# Patient Record
Sex: Female | Born: 1935 | Race: White | Hispanic: No | Smoking: Former smoker
Health system: Southern US, Community
[De-identification: ages and names within clinical notes are randomized; demographics above are authoritative.]

## PROBLEM LIST (undated history)

## (undated) DIAGNOSIS — J449 Chronic obstructive pulmonary disease, unspecified: Secondary | ICD-10-CM

## (undated) DIAGNOSIS — T7840XA Allergy, unspecified, initial encounter: Secondary | ICD-10-CM

## (undated) DIAGNOSIS — M353 Polymyalgia rheumatica: Secondary | ICD-10-CM

## (undated) DIAGNOSIS — I10 Essential (primary) hypertension: Secondary | ICD-10-CM

## (undated) DIAGNOSIS — E785 Hyperlipidemia, unspecified: Secondary | ICD-10-CM

## (undated) HISTORY — DX: Hyperlipidemia, unspecified: E78.5

## (undated) HISTORY — DX: Chronic obstructive pulmonary disease, unspecified: J44.9

## (undated) HISTORY — DX: Polymyalgia rheumatica: M35.3

## (undated) HISTORY — DX: Allergy, unspecified, initial encounter: T78.40XA

## (undated) HISTORY — PX: THYROGLOSSAL DUCT CYST: SHX297

## (undated) HISTORY — PX: DILATION AND CURETTAGE OF UTERUS: SHX78

## (undated) HISTORY — DX: Essential (primary) hypertension: I10

## (undated) HISTORY — PX: OTHER SURGICAL HISTORY: SHX169

---

## 2004-04-08 ENCOUNTER — Encounter: Admission: RE | Admit: 2004-04-08 | Discharge: 2004-05-15 | Payer: Self-pay | Admitting: Internal Medicine

## 2004-06-04 ENCOUNTER — Ambulatory Visit: Payer: Self-pay | Admitting: Critical Care Medicine

## 2004-06-30 ENCOUNTER — Ambulatory Visit: Payer: Self-pay | Admitting: Internal Medicine

## 2004-07-14 ENCOUNTER — Ambulatory Visit: Payer: Self-pay | Admitting: Critical Care Medicine

## 2004-07-21 ENCOUNTER — Ambulatory Visit: Payer: Self-pay | Admitting: Critical Care Medicine

## 2004-08-26 ENCOUNTER — Ambulatory Visit: Payer: Self-pay | Admitting: Internal Medicine

## 2004-09-22 ENCOUNTER — Ambulatory Visit: Payer: Self-pay | Admitting: Critical Care Medicine

## 2004-10-06 ENCOUNTER — Ambulatory Visit: Payer: Self-pay | Admitting: Internal Medicine

## 2004-11-17 ENCOUNTER — Ambulatory Visit: Payer: Self-pay | Admitting: Internal Medicine

## 2004-11-18 ENCOUNTER — Ambulatory Visit: Payer: Self-pay | Admitting: Critical Care Medicine

## 2004-11-19 ENCOUNTER — Ambulatory Visit: Payer: Self-pay | Admitting: Internal Medicine

## 2004-11-24 ENCOUNTER — Ambulatory Visit: Payer: Self-pay | Admitting: Internal Medicine

## 2004-11-28 ENCOUNTER — Ambulatory Visit: Payer: Self-pay | Admitting: Internal Medicine

## 2005-02-17 ENCOUNTER — Ambulatory Visit: Payer: Self-pay | Admitting: Critical Care Medicine

## 2005-02-27 ENCOUNTER — Ambulatory Visit: Payer: Self-pay | Admitting: Internal Medicine

## 2005-03-12 ENCOUNTER — Ambulatory Visit: Payer: Self-pay | Admitting: Critical Care Medicine

## 2005-04-08 ENCOUNTER — Ambulatory Visit: Payer: Self-pay | Admitting: Internal Medicine

## 2005-05-20 ENCOUNTER — Ambulatory Visit: Payer: Self-pay | Admitting: Internal Medicine

## 2005-06-17 ENCOUNTER — Ambulatory Visit: Payer: Self-pay | Admitting: Critical Care Medicine

## 2005-07-01 ENCOUNTER — Ambulatory Visit: Payer: Self-pay | Admitting: Internal Medicine

## 2005-07-28 ENCOUNTER — Ambulatory Visit: Payer: Self-pay | Admitting: Internal Medicine

## 2005-08-24 ENCOUNTER — Ambulatory Visit: Payer: Self-pay | Admitting: Internal Medicine

## 2005-09-15 ENCOUNTER — Ambulatory Visit: Payer: Self-pay | Admitting: Critical Care Medicine

## 2005-09-28 ENCOUNTER — Ambulatory Visit: Payer: Self-pay | Admitting: Internal Medicine

## 2005-10-16 ENCOUNTER — Ambulatory Visit: Payer: Self-pay | Admitting: Internal Medicine

## 2005-11-12 ENCOUNTER — Ambulatory Visit: Payer: Self-pay | Admitting: Pulmonary Disease

## 2005-11-19 ENCOUNTER — Ambulatory Visit: Payer: Self-pay | Admitting: Critical Care Medicine

## 2005-11-24 ENCOUNTER — Ambulatory Visit: Payer: Self-pay | Admitting: Internal Medicine

## 2006-01-05 ENCOUNTER — Ambulatory Visit: Payer: Self-pay | Admitting: Critical Care Medicine

## 2006-01-19 ENCOUNTER — Ambulatory Visit: Payer: Self-pay | Admitting: Internal Medicine

## 2006-02-22 ENCOUNTER — Ambulatory Visit: Payer: Self-pay | Admitting: Family Medicine

## 2006-03-30 ENCOUNTER — Ambulatory Visit: Payer: Self-pay | Admitting: Critical Care Medicine

## 2006-04-06 ENCOUNTER — Ambulatory Visit: Payer: Self-pay | Admitting: Internal Medicine

## 2006-04-16 ENCOUNTER — Ambulatory Visit: Payer: Self-pay | Admitting: Internal Medicine

## 2006-05-10 ENCOUNTER — Ambulatory Visit: Payer: Self-pay | Admitting: Internal Medicine

## 2006-05-12 ENCOUNTER — Ambulatory Visit: Payer: Self-pay | Admitting: Critical Care Medicine

## 2006-05-12 ENCOUNTER — Encounter: Admission: RE | Admit: 2006-05-12 | Discharge: 2006-05-12 | Payer: Self-pay | Admitting: Internal Medicine

## 2006-05-20 ENCOUNTER — Ambulatory Visit: Payer: Self-pay | Admitting: Internal Medicine

## 2006-05-20 LAB — CONVERTED CEMR LAB
ALT: 27 units/L (ref 0–40)
AST: 23 units/L (ref 0–37)
Albumin: 4 g/dL (ref 3.5–5.2)
Alkaline Phosphatase: 89 units/L (ref 39–117)
BUN: 38 mg/dL — ABNORMAL HIGH (ref 6–23)
Basophils Absolute: 0 10*3/uL (ref 0.0–0.1)
Basophils Relative: 0 % (ref 0.0–1.0)
CO2: 27 meq/L (ref 19–32)
Calcium: 9.8 mg/dL (ref 8.4–10.5)
Chloride: 108 meq/L (ref 96–112)
Chol/HDL Ratio, serum: 5.3
Cholesterol: 240 mg/dL (ref 0–200)
Creatinine, Ser: 1.1 mg/dL (ref 0.4–1.2)
Eosinophil percent: 3.4 % (ref 0.0–5.0)
GFR calc non Af Amer: 50 mL/min
Glomerular Filtration Rate, Af Am: 61 mL/min/{1.73_m2}
Glucose, Bld: 101 mg/dL — ABNORMAL HIGH (ref 70–99)
HCT: 40.5 % (ref 36.0–46.0)
HDL: 45.4 mg/dL (ref >39.0)
Hemoglobin: 13.6 g/dL (ref 12.0–15.0)
LDL DIRECT: 170.9 mg/dL
Lymphocytes Relative: 30.5 % (ref 12.0–46.0)
MCHC: 33.6 g/dL (ref 30.0–36.0)
MCV: 90.9 fL (ref 78.0–100.0)
Monocytes Absolute: 0.5 10*3/uL (ref 0.2–0.7)
Monocytes Relative: 10 % (ref 3.0–11.0)
Neutro Abs: 2.8 10*3/uL (ref 1.4–7.7)
Neutrophils Relative %: 56.1 % (ref 43.0–77.0)
Platelets: 233 10*3/uL (ref 150–400)
Potassium: 4.8 meq/L (ref 3.5–5.1)
RBC: 4.45 M/uL (ref 3.87–5.11)
RDW: 12.6 % (ref 11.5–14.6)
Sodium: 144 meq/L (ref 135–145)
TSH: 1.54 microintl units/mL (ref 0.35–5.50)
Total Bilirubin: 0.7 mg/dL (ref 0.3–1.2)
Total Protein: 7 g/dL (ref 6.0–8.3)
Triglyceride fasting, serum: 103 mg/dL (ref 0–149)
VLDL: 21 mg/dL (ref 0–40)
WBC: 5.1 10*3/uL (ref 4.5–10.5)

## 2006-05-27 ENCOUNTER — Ambulatory Visit: Payer: Self-pay | Admitting: Internal Medicine

## 2006-08-23 ENCOUNTER — Ambulatory Visit: Payer: Self-pay | Admitting: Critical Care Medicine

## 2006-08-27 ENCOUNTER — Ambulatory Visit: Payer: Self-pay | Admitting: Internal Medicine

## 2006-09-10 ENCOUNTER — Ambulatory Visit: Payer: Self-pay | Admitting: Internal Medicine

## 2006-10-04 ENCOUNTER — Ambulatory Visit: Payer: Self-pay | Admitting: Internal Medicine

## 2006-10-18 ENCOUNTER — Ambulatory Visit: Payer: Self-pay | Admitting: Internal Medicine

## 2006-10-19 ENCOUNTER — Encounter: Admission: RE | Admit: 2006-10-19 | Discharge: 2006-10-19 | Payer: Self-pay | Admitting: Internal Medicine

## 2006-11-03 ENCOUNTER — Ambulatory Visit: Payer: Self-pay | Admitting: Critical Care Medicine

## 2006-11-12 ENCOUNTER — Ambulatory Visit: Payer: Self-pay | Admitting: Internal Medicine

## 2006-12-06 ENCOUNTER — Ambulatory Visit: Payer: Self-pay | Admitting: Critical Care Medicine

## 2006-12-20 ENCOUNTER — Ambulatory Visit: Payer: Self-pay | Admitting: Internal Medicine

## 2006-12-20 DIAGNOSIS — E785 Hyperlipidemia, unspecified: Secondary | ICD-10-CM | POA: Insufficient documentation

## 2006-12-20 DIAGNOSIS — J449 Chronic obstructive pulmonary disease, unspecified: Secondary | ICD-10-CM

## 2006-12-20 DIAGNOSIS — I10 Essential (primary) hypertension: Secondary | ICD-10-CM | POA: Insufficient documentation

## 2006-12-20 DIAGNOSIS — M810 Age-related osteoporosis without current pathological fracture: Secondary | ICD-10-CM | POA: Insufficient documentation

## 2006-12-21 ENCOUNTER — Ambulatory Visit: Payer: Self-pay | Admitting: Critical Care Medicine

## 2006-12-21 LAB — CONVERTED CEMR LAB
ALT: 33 units/L (ref 0–40)
AST: 29 units/L (ref 0–37)
Albumin: 4.1 g/dL (ref 3.5–5.2)
Alkaline Phosphatase: 78 units/L (ref 39–117)
BUN: 34 mg/dL — ABNORMAL HIGH (ref 6–23)
Bilirubin, Direct: 0.1 mg/dL (ref 0.0–0.3)
CO2: 26 meq/L (ref 19–32)
Calcium: 9.4 mg/dL (ref 8.4–10.5)
Chloride: 110 meq/L (ref 96–112)
Cholesterol: 231 mg/dL (ref 0–200)
Creatinine, Ser: 1.1 mg/dL (ref 0.4–1.2)
Direct LDL: 143.6 mg/dL
GFR calc Af Amer: 61 mL/min
GFR calc non Af Amer: 50 mL/min
Glucose, Bld: 87 mg/dL (ref 70–99)
HDL: 50.2 mg/dL (ref >39.0)
Potassium: 5.4 meq/L — ABNORMAL HIGH (ref 3.5–5.1)
Sodium: 143 meq/L (ref 135–145)
Total Bilirubin: 0.7 mg/dL (ref 0.3–1.2)
Total CHOL/HDL Ratio: 4.6
Total Protein: 6.7 g/dL (ref 6.0–8.3)
Triglycerides: 184 mg/dL — ABNORMAL HIGH (ref 0–149)
VLDL: 37 mg/dL (ref 0–40)

## 2007-02-23 ENCOUNTER — Ambulatory Visit: Payer: Self-pay | Admitting: Internal Medicine

## 2007-03-01 ENCOUNTER — Ambulatory Visit: Payer: Self-pay | Admitting: Pulmonary Disease

## 2007-03-16 ENCOUNTER — Encounter: Payer: Self-pay | Admitting: Internal Medicine

## 2007-05-11 ENCOUNTER — Ambulatory Visit: Payer: Self-pay | Admitting: Internal Medicine

## 2007-06-09 ENCOUNTER — Ambulatory Visit: Payer: Self-pay | Admitting: Critical Care Medicine

## 2007-08-09 ENCOUNTER — Ambulatory Visit: Payer: Self-pay | Admitting: Internal Medicine

## 2007-08-10 LAB — CONVERTED CEMR LAB
Basophils Relative: 0.4 % (ref 0.0–1.0)
Eosinophils Relative: 1.7 % (ref 0.0–5.0)
HCT: 40.6 % (ref 36.0–46.0)
Neutrophils Relative %: 59.2 % (ref 43.0–77.0)
RBC: 4.47 M/uL (ref 3.87–5.11)
RDW: 12.2 % (ref 11.5–14.6)
WBC: 7 10*3/uL (ref 4.5–10.5)

## 2007-09-14 ENCOUNTER — Ambulatory Visit: Payer: Self-pay | Admitting: Internal Medicine

## 2007-09-15 LAB — CONVERTED CEMR LAB
Calcium: 9.6 mg/dL (ref 8.4–10.5)
Chloride: 103 meq/L (ref 96–112)
Direct LDL: 138 mg/dL
GFR calc non Af Amer: 56 mL/min
Sodium: 139 meq/L (ref 135–145)
Total CHOL/HDL Ratio: 3.8
VLDL: 22 mg/dL (ref 0–40)

## 2007-09-29 ENCOUNTER — Telehealth: Payer: Self-pay | Admitting: Internal Medicine

## 2007-09-29 ENCOUNTER — Ambulatory Visit: Payer: Self-pay | Admitting: Internal Medicine

## 2007-12-16 ENCOUNTER — Ambulatory Visit: Payer: Self-pay | Admitting: Internal Medicine

## 2007-12-20 ENCOUNTER — Telehealth: Payer: Self-pay | Admitting: Internal Medicine

## 2008-01-12 ENCOUNTER — Ambulatory Visit: Payer: Self-pay | Admitting: Internal Medicine

## 2008-01-26 ENCOUNTER — Ambulatory Visit: Payer: Self-pay | Admitting: Internal Medicine

## 2008-02-10 ENCOUNTER — Telehealth: Payer: Self-pay | Admitting: Internal Medicine

## 2008-03-26 ENCOUNTER — Telehealth: Payer: Self-pay | Admitting: *Deleted

## 2008-04-12 IMAGING — CR DG LUMBAR SPINE COMPLETE 4+V
5 series · 5 of 5 positions shown · non-contrast
Comparison: none

CLINICAL DATA: Mid to low back pain.  Question compression deformity. 
THORACIC SPINE THREE VIEWS:
No comparison.

[t l-spine a.p.]
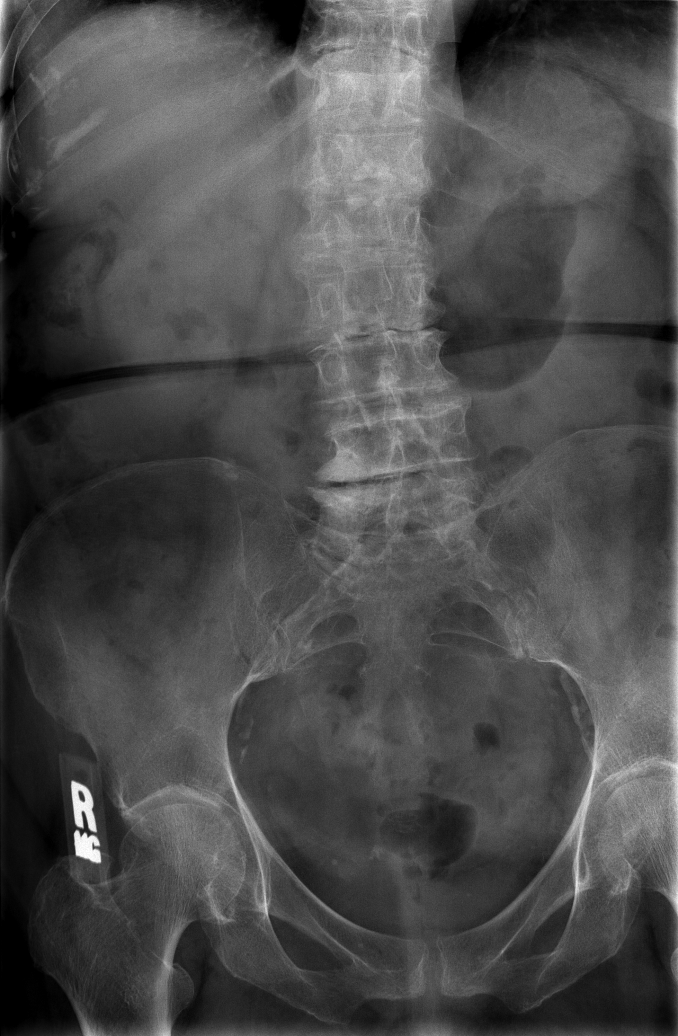

[t l-spine oblique exposure (1 of 2)]
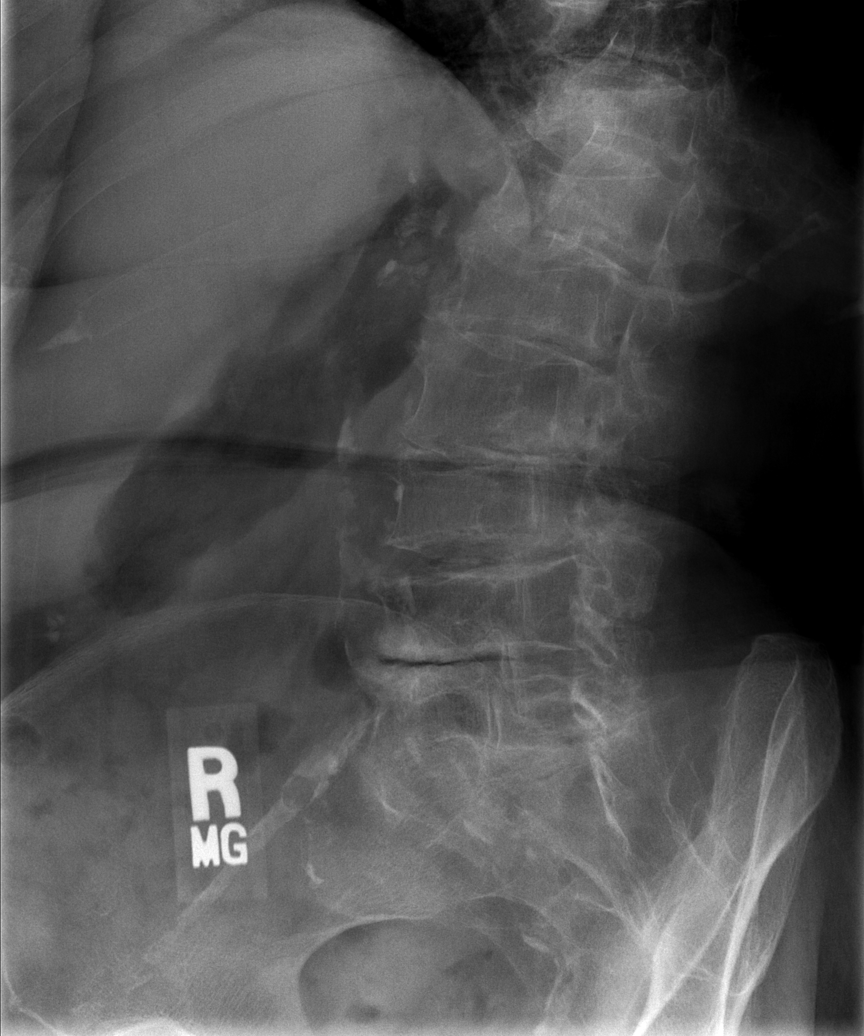

[t l-spine oblique exposure (2 of 2)]
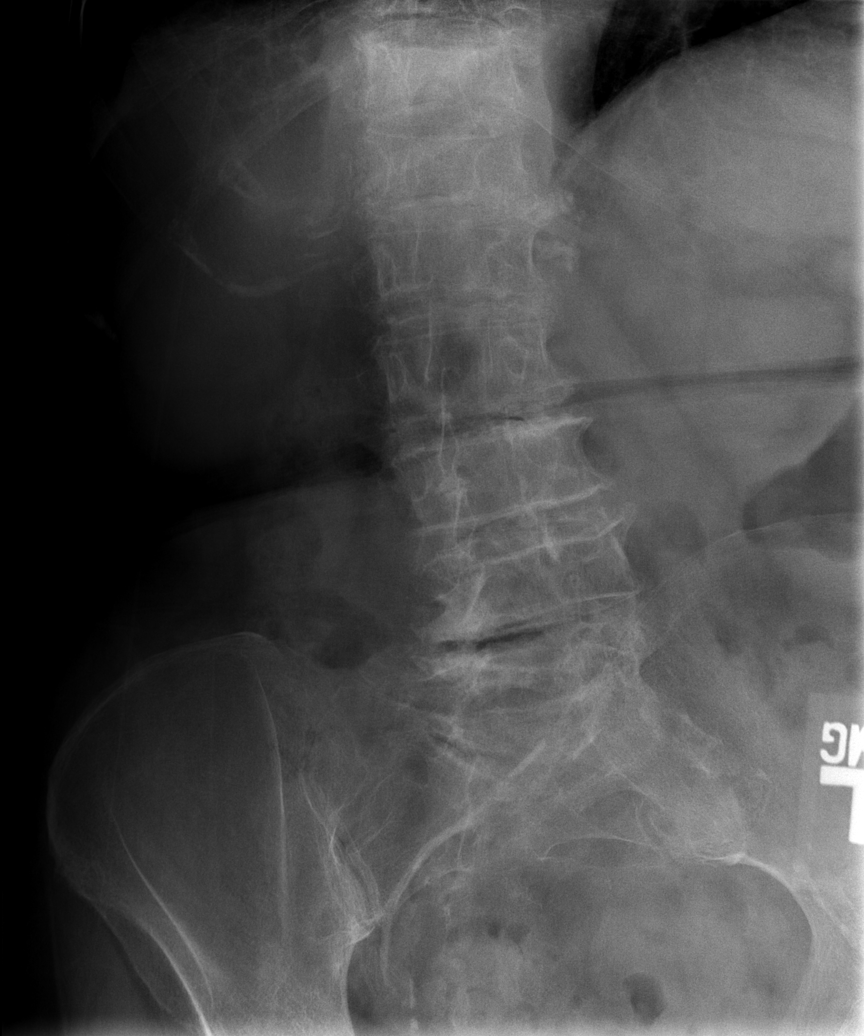

[t l-spine lat]
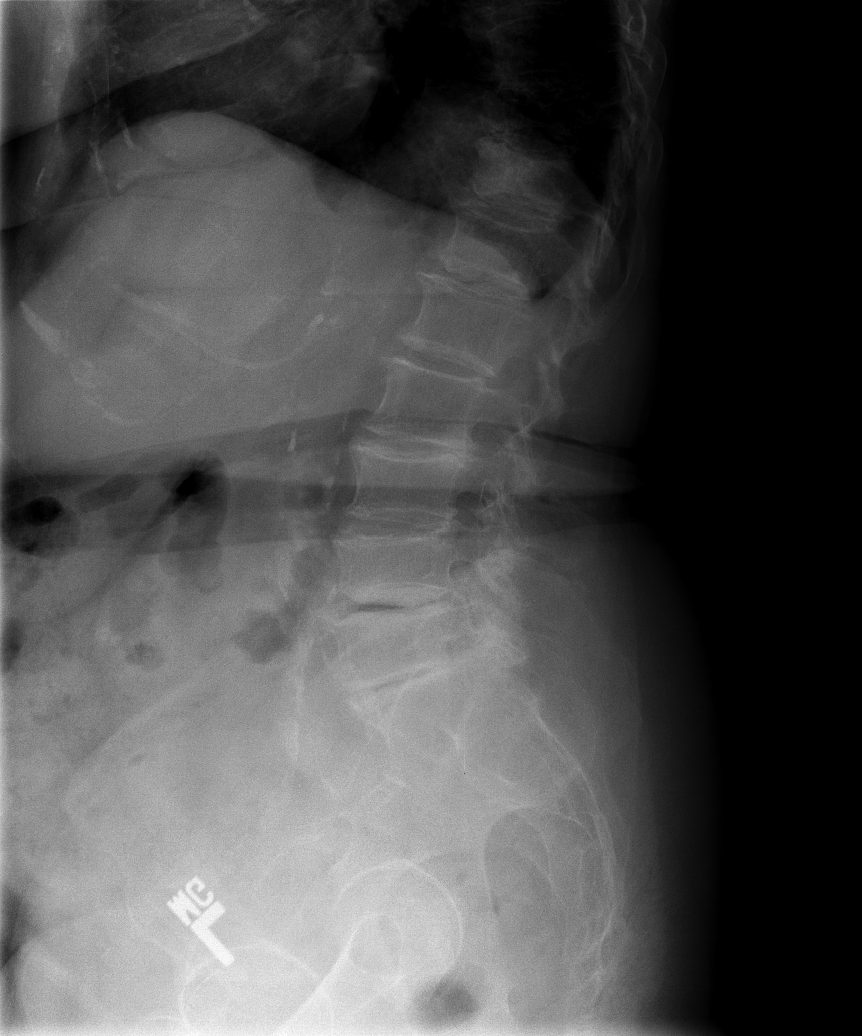

[t l-spine l5-s1 spot]
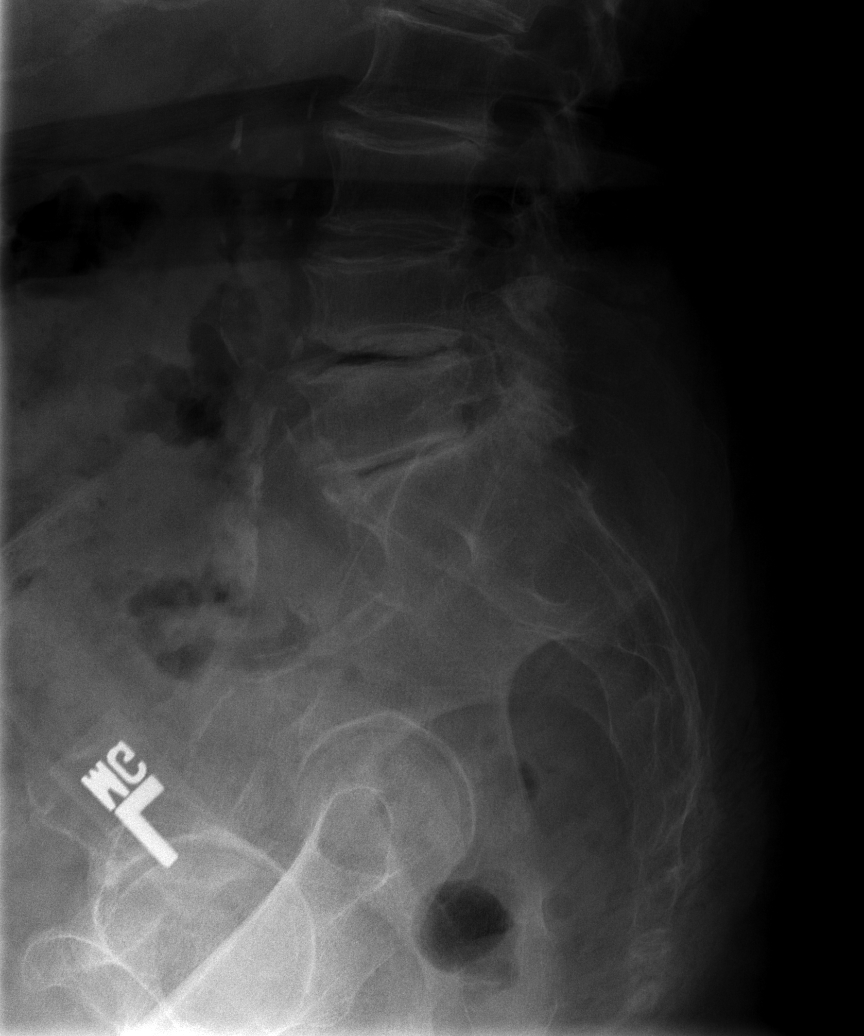

[5 of 5 positions shown; findings below may reference images not displayed]

FINDINGS: There is osteopenia.  An approximately 40% anterior compression deformity is noted at T10.  There is also mild anterior wedging of T11.  These fracture are radiographically indeterminant in age, but no definite acute components are seen.  There is no retropulsion.  The alignment is anatomic.  There is no widening of the interpedicular distance or evidence of paraspinal hematoma.
IMPRESSION: Mild compression deformities at T10 and T11, radiographically indeterminant, but not likely acute; correlate with area of pain.  Moderate osteopenia.  
LUMBAR SPINE FOUR VIEWS:
There are five lumbar type vertebral bodies demonstrating a mild convex right scoliosis and anatomic lateral alignment.  There is multilevel disc space loss, most advanced at L4-5 and L5-S1.  Facet degenerative changes are present bilaterally.  There is no evidence of acute fracture or pars defect.  There is atherosclerosis of the iliac arteries.
IMPRESSION: No evidence of acute lumbar spine compression deformity.  Moderate spondylosis and osteopenia are noted.

## 2008-04-23 ENCOUNTER — Ambulatory Visit: Payer: Self-pay | Admitting: Critical Care Medicine

## 2008-05-30 ENCOUNTER — Ambulatory Visit: Payer: Self-pay | Admitting: Critical Care Medicine

## 2008-07-25 ENCOUNTER — Ambulatory Visit: Payer: Self-pay | Admitting: Internal Medicine

## 2008-07-26 LAB — CONVERTED CEMR LAB
ALT: 24 units/L (ref 0–35)
AST: 22 units/L (ref 0–37)
Alkaline Phosphatase: 75 units/L (ref 39–117)
Bilirubin, Direct: 0.1 mg/dL (ref 0.0–0.3)
CO2: 25 meq/L (ref 19–32)
Calcium: 9.4 mg/dL (ref 8.4–10.5)
Chloride: 109 meq/L (ref 96–112)
Glucose, Bld: 109 mg/dL — ABNORMAL HIGH (ref 70–99)
Hemoglobin: 13 g/dL (ref 12.0–15.0)
Lymphocytes Relative: 31.2 % (ref 12.0–46.0)
Monocytes Relative: 10.8 % (ref 3.0–12.0)
Neutro Abs: 2.6 10*3/uL (ref 1.4–7.7)
Potassium: 5 meq/L (ref 3.5–5.1)
RBC: 4.08 M/uL (ref 3.87–5.11)
RDW: 12.2 % (ref 11.5–14.6)
Sodium: 140 meq/L (ref 135–145)
Total Protein: 6.7 g/dL (ref 6.0–8.3)

## 2008-08-27 ENCOUNTER — Ambulatory Visit: Payer: Self-pay | Admitting: Critical Care Medicine

## 2008-11-14 ENCOUNTER — Ambulatory Visit: Payer: Self-pay | Admitting: Family Medicine

## 2008-11-22 ENCOUNTER — Telehealth: Payer: Self-pay | Admitting: Internal Medicine

## 2008-12-13 ENCOUNTER — Ambulatory Visit: Payer: Self-pay | Admitting: Critical Care Medicine

## 2008-12-21 ENCOUNTER — Encounter: Payer: Self-pay | Admitting: Critical Care Medicine

## 2009-02-08 ENCOUNTER — Ambulatory Visit: Payer: Self-pay | Admitting: Critical Care Medicine

## 2009-03-22 ENCOUNTER — Ambulatory Visit: Payer: Self-pay | Admitting: Internal Medicine

## 2009-03-22 DIAGNOSIS — M353 Polymyalgia rheumatica: Secondary | ICD-10-CM

## 2009-04-29 ENCOUNTER — Ambulatory Visit: Payer: Self-pay | Admitting: Critical Care Medicine

## 2009-05-16 ENCOUNTER — Telehealth (INDEPENDENT_AMBULATORY_CARE_PROVIDER_SITE_OTHER): Payer: Self-pay | Admitting: *Deleted

## 2009-05-17 ENCOUNTER — Ambulatory Visit: Payer: Self-pay | Admitting: Internal Medicine

## 2009-05-17 DIAGNOSIS — M1990 Unspecified osteoarthritis, unspecified site: Secondary | ICD-10-CM

## 2009-06-13 ENCOUNTER — Telehealth (INDEPENDENT_AMBULATORY_CARE_PROVIDER_SITE_OTHER): Payer: Self-pay | Admitting: *Deleted

## 2009-06-13 ENCOUNTER — Telehealth: Payer: Self-pay | Admitting: Critical Care Medicine

## 2009-06-13 ENCOUNTER — Ambulatory Visit: Payer: Self-pay | Admitting: Family Medicine

## 2009-07-02 ENCOUNTER — Ambulatory Visit (HOSPITAL_COMMUNITY): Admission: RE | Admit: 2009-07-02 | Discharge: 2009-07-02 | Payer: Self-pay | Admitting: Family Medicine

## 2009-07-02 ENCOUNTER — Encounter: Payer: Self-pay | Admitting: Family Medicine

## 2009-07-02 ENCOUNTER — Ambulatory Visit: Payer: Self-pay

## 2009-07-02 ENCOUNTER — Ambulatory Visit: Payer: Self-pay | Admitting: Cardiology

## 2009-07-02 ENCOUNTER — Encounter: Payer: Self-pay | Admitting: Internal Medicine

## 2009-07-08 ENCOUNTER — Telehealth: Payer: Self-pay | Admitting: Internal Medicine

## 2009-07-10 ENCOUNTER — Telehealth: Payer: Self-pay | Admitting: Internal Medicine

## 2009-07-11 ENCOUNTER — Telehealth (INDEPENDENT_AMBULATORY_CARE_PROVIDER_SITE_OTHER): Payer: Self-pay | Admitting: *Deleted

## 2009-08-01 ENCOUNTER — Ambulatory Visit: Payer: Self-pay | Admitting: Family Medicine

## 2009-08-02 ENCOUNTER — Encounter: Payer: Self-pay | Admitting: Internal Medicine

## 2009-08-05 ENCOUNTER — Ambulatory Visit: Payer: Self-pay | Admitting: Internal Medicine

## 2009-08-09 ENCOUNTER — Ambulatory Visit: Payer: Self-pay | Admitting: Internal Medicine

## 2009-08-15 ENCOUNTER — Telehealth: Payer: Self-pay | Admitting: Internal Medicine

## 2009-08-19 ENCOUNTER — Ambulatory Visit: Payer: Self-pay | Admitting: Internal Medicine

## 2009-08-23 ENCOUNTER — Telehealth: Payer: Self-pay | Admitting: Internal Medicine

## 2009-08-30 ENCOUNTER — Ambulatory Visit: Payer: Self-pay | Admitting: Critical Care Medicine

## 2009-09-04 ENCOUNTER — Ambulatory Visit: Payer: Self-pay | Admitting: Internal Medicine

## 2009-09-04 DIAGNOSIS — M109 Gout, unspecified: Secondary | ICD-10-CM | POA: Insufficient documentation

## 2009-09-09 ENCOUNTER — Telehealth: Payer: Self-pay | Admitting: Internal Medicine

## 2009-09-12 ENCOUNTER — Telehealth: Payer: Self-pay | Admitting: Internal Medicine

## 2009-09-20 ENCOUNTER — Ambulatory Visit: Payer: Self-pay | Admitting: Internal Medicine

## 2009-09-20 DIAGNOSIS — R609 Edema, unspecified: Secondary | ICD-10-CM | POA: Insufficient documentation

## 2009-10-25 ENCOUNTER — Ambulatory Visit: Payer: Self-pay | Admitting: Internal Medicine

## 2009-10-29 ENCOUNTER — Telehealth: Payer: Self-pay | Admitting: Internal Medicine

## 2009-11-05 ENCOUNTER — Telehealth: Payer: Self-pay | Admitting: Internal Medicine

## 2009-11-11 ENCOUNTER — Ambulatory Visit: Payer: Self-pay | Admitting: Internal Medicine

## 2009-11-12 ENCOUNTER — Encounter: Payer: Self-pay | Admitting: Internal Medicine

## 2009-11-20 ENCOUNTER — Ambulatory Visit: Payer: Self-pay | Admitting: Internal Medicine

## 2009-12-06 ENCOUNTER — Ambulatory Visit: Payer: Self-pay | Admitting: Internal Medicine

## 2009-12-06 LAB — CONVERTED CEMR LAB
Calcium: 9.4 mg/dL (ref 8.4–10.5)
Chloride: 110 meq/L (ref 96–112)
Creatinine, Ser: 0.9 mg/dL (ref 0.4–1.2)
Glucose, Bld: 94 mg/dL (ref 70–99)
Potassium: 4.3 meq/L (ref 3.5–5.1)
Sodium: 142 meq/L (ref 135–145)
Uric Acid, Serum: 8.8 mg/dL — ABNORMAL HIGH (ref 2.4–7.0)

## 2009-12-20 ENCOUNTER — Ambulatory Visit: Payer: Self-pay | Admitting: Critical Care Medicine

## 2009-12-20 ENCOUNTER — Encounter: Payer: Self-pay | Admitting: Internal Medicine

## 2009-12-24 ENCOUNTER — Encounter: Payer: Self-pay | Admitting: Internal Medicine

## 2010-01-22 ENCOUNTER — Ambulatory Visit: Payer: Self-pay | Admitting: Internal Medicine

## 2010-01-24 ENCOUNTER — Telehealth: Payer: Self-pay | Admitting: Internal Medicine

## 2010-01-28 ENCOUNTER — Telehealth: Payer: Self-pay | Admitting: Internal Medicine

## 2010-02-07 ENCOUNTER — Ambulatory Visit: Payer: Self-pay | Admitting: Internal Medicine

## 2010-02-21 ENCOUNTER — Ambulatory Visit: Payer: Self-pay | Admitting: Internal Medicine

## 2010-03-05 ENCOUNTER — Telehealth: Payer: Self-pay | Admitting: Internal Medicine

## 2010-03-05 ENCOUNTER — Ambulatory Visit: Payer: Self-pay | Admitting: Internal Medicine

## 2010-03-28 ENCOUNTER — Ambulatory Visit: Payer: Self-pay | Admitting: Internal Medicine

## 2010-04-25 ENCOUNTER — Ambulatory Visit: Payer: Self-pay | Admitting: Internal Medicine

## 2010-04-28 ENCOUNTER — Telehealth (INDEPENDENT_AMBULATORY_CARE_PROVIDER_SITE_OTHER): Payer: Self-pay | Admitting: *Deleted

## 2010-05-06 ENCOUNTER — Telehealth: Payer: Self-pay | Admitting: Internal Medicine

## 2010-05-06 ENCOUNTER — Telehealth: Payer: Self-pay | Admitting: Critical Care Medicine

## 2010-05-20 ENCOUNTER — Telehealth: Payer: Self-pay | Admitting: Internal Medicine

## 2010-05-26 ENCOUNTER — Ambulatory Visit: Payer: Self-pay | Admitting: Internal Medicine

## 2010-06-17 ENCOUNTER — Ambulatory Visit: Payer: Self-pay | Admitting: Internal Medicine

## 2010-06-25 ENCOUNTER — Ambulatory Visit: Payer: Self-pay | Admitting: Critical Care Medicine

## 2010-07-16 ENCOUNTER — Ambulatory Visit: Payer: Self-pay | Admitting: Internal Medicine

## 2010-08-06 ENCOUNTER — Telehealth: Payer: Self-pay | Admitting: Internal Medicine

## 2010-08-24 ENCOUNTER — Encounter: Payer: Self-pay | Admitting: Internal Medicine

## 2010-08-27 ENCOUNTER — Ambulatory Visit
Admission: RE | Admit: 2010-08-27 | Discharge: 2010-08-27 | Payer: Self-pay | Source: Home / Self Care | Attending: Internal Medicine | Admitting: Internal Medicine

## 2010-08-31 LAB — CONVERTED CEMR LAB
AST: 19 units/L (ref 0–37)
Alkaline Phosphatase: 68 units/L (ref 39–117)
BUN: 33 mg/dL — ABNORMAL HIGH (ref 6–23)
Basophils Absolute: 0 10*3/uL (ref 0.0–0.1)
Calcium: 9.5 mg/dL (ref 8.4–10.5)
Creatinine, Ser: 1 mg/dL (ref 0.4–1.2)
Eosinophils Absolute: 0.1 10*3/uL (ref 0.0–0.7)
GFR calc non Af Amer: 55.74 mL/min (ref >60)
Glucose, Bld: 103 mg/dL — ABNORMAL HIGH (ref 70–99)
Lymphocytes Relative: 26.2 % (ref 12.0–46.0)
MCHC: 34.6 g/dL (ref 30.0–36.0)
Monocytes Relative: 10.3 % (ref 3.0–12.0)
Platelets: 179 10*3/uL (ref 150.0–400.0)
RDW: 12.9 % (ref 11.5–14.6)
TSH: 1.06 microintl units/mL (ref 0.35–5.50)
Total Bilirubin: 0.8 mg/dL (ref 0.3–1.2)

## 2010-09-03 NOTE — Assessment & Plan Note (Signed)
Summary: Pulmonary OV   Primary Beuford Garcilazo/Referring Thresa Dozier:  Birdie Sons MD  CC:  6 month follow up.  sneezing and blowing clear mucus out of nose x 2 wks.  SOB with exertion but this is no better or worse from last OV.  Denies cough and chest tightness.Marland Kitchen  History of Present Illness: COPD f/u:       This is an 77 years old female with advanced COPD Golds Stage II    June 25, 2010 4:05 PM Notes more nasal discharged.  uses saline alone as gets nasal discharge Having issues with pmr and gout. Pt denies any significant sore throat, nasal congestion or excess secretions, fever, chills, sweats, unintended weight loss, pleurtic or exertional chest pain, orthopnea PND, or leg swelling Pt denies any increase in rescue therapy over baseline, denies waking up needing it or having any early am or nocturnal exacerbations of coughing/wheezing/or dyspnea. Pt also denies any obvious fluctuation in symptoms wiht weather or environmental change or other alleviating or aggravating factors   Current Medications (verified): 1)  Advair Diskus 250-50 Mcg/dose Misc (Fluticasone-Salmeterol) .... Inhale 1 Puff As Directed Twice A Day 2)  Centrum Silver   Tabs (Multiple Vitamins-Minerals) .... Once Daily 3)  Proair Hfa 108 (90 Base) Mcg/act Aers (Albuterol Sulfate) .... As Needed 4)  Benicar 40 Mg Tabs (Olmesartan Medoxomil) .... Once Daily 5)  Labetalol Hcl 200 Mg Tabs (Labetalol Hcl) .Marland Kitchen.. 1 Tab Twice Daily 6)  Allopurinol 300 Mg Tabs (Allopurinol) .... Take 1 Tablet By Mouth Once A Day 7)  Prednisone 10 Mg  Tabs (Prednisone) .... Take One Tablet By Mouth Every Other Day Alternating With 1/2 By Mouth Every Other Day 8)  Vitamin D3 1000 Unit Tabs (Cholecalciferol) .... Take 1 Tablet By Mouth Once A Day  Allergies (verified): 1)  ! Asa 2)  ! * Colcrys  Past History:  Past medical, surgical, family and social histories (including risk factors) reviewed, and no changes noted (except as noted  below).  Past Medical History: Allergic rhinitis COPD stage II Golds  Hyperlipidemia Hypertension Osteoporosis Gout  PMR   -cyclic prednisone  Past Surgical History: Reviewed history from 12/20/2006 and no changes required. D and C fractured arm--surgical repair thyroglossal cyst  Family History: Reviewed history from 12/20/2006 and no changes required. Mother deceased heart disease-dm Family History Diabetes 1st degree relative-mother father-65 lung CA Family History Lung cancer  Social History: Reviewed history from 12/20/2006 and no changes required. Former Smoker Alcohol use-no Regular exercise-no  Review of Systems       The patient complains of shortness of breath with activity, nasal congestion/difficulty breathing through nose, and joint stiffness or pain.  The patient denies shortness of breath at rest, productive cough, non-productive cough, coughing up blood, chest pain, irregular heartbeats, acid heartburn, indigestion, loss of appetite, weight change, abdominal pain, difficulty swallowing, sore throat, tooth/dental problems, headaches, sneezing, itching, ear ache, anxiety, depression, hand/feet swelling, rash, change in color of mucus, and fever.    Vital Signs:  Patient profile:   77 year old female Height:      59 inches Weight:      154.13 pounds BMI:     31.24 O2 Sat:      96 % on Room air Temp:     97.7 degrees F oral Pulse rate:   95 / minute BP sitting:   140 / 84  (right arm) Cuff size:   regular  Vitals Entered By: Gweneth Dimitri RN (June 25, 2010 3:59 PM)  O2 Flow:  Room air CC: 6 month follow up.  sneezing and blowing clear mucus out of nose x 2 wks.  SOB with exertion but this is no better or worse from last OV.  Denies cough and chest tightness. Comments Medications reviewed with patient Daytime contact number verified with patient. Gweneth Dimitri RN  June 25, 2010 3:59 PM    Physical Exam  Additional Exam:  Gen: Pleasant,  well nourished, in no distress ENT: no lesions, no postnasal drip Neck: No JVD, no TMG, no carotid bruits Lungs: no use of accessory muscles, no dullness to percussion, clear without rales or rhonchi Cardiovascular: RRR, heart sounds normal, no murmurs or gallops, no peripheral edema Musculoskeletal: No deformities, no cyanosis or clubbing     Impression & Recommendations:  Problem # 1:  COPD (ICD-496) Assessment Improved  Golds Stage II COPD  stable plan cont advair   Medications Added to Medication List This Visit: 1)  Vitamin D3 1000 Unit Tabs (Cholecalciferol) .... Take 1 tablet by mouth once a day  Complete Medication List: 1)  Advair Diskus 250-50 Mcg/dose Misc (Fluticasone-salmeterol) .... Inhale 1 puff as directed twice a day 2)  Centrum Silver Tabs (Multiple vitamins-minerals) .... Once daily 3)  Proair Hfa 108 (90 Base) Mcg/act Aers (Albuterol sulfate) .... As needed 4)  Benicar 40 Mg Tabs (Olmesartan medoxomil) .... Once daily 5)  Labetalol Hcl 200 Mg Tabs (Labetalol hcl) .Marland Kitchen.. 1 tab twice daily 6)  Allopurinol 300 Mg Tabs (Allopurinol) .... Take 1 tablet by mouth once a day 7)  Prednisone 10 Mg Tabs (Prednisone) .... Take one tablet by mouth every other day alternating with 1/2 by mouth every other day 8)  Vitamin D3 1000 Unit Tabs (Cholecalciferol) .... Take 1 tablet by mouth once a day  Other Orders: Est. Patient Level III ([address])  Patient Instructions: 1)  No change in medications 2)  Return in    6      months Prescriptions: ADVAIR DISKUS 250-50 MCG/DOSE MISC (FLUTICASONE-SALMETEROL) Inhale 1 puff as directed twice a day Brand medically necessary #60 Each x 6   Entered and Authorized by:   Storm Frisk MD   Signed by:   Storm Frisk MD on 06/25/2010   Method used:   Electronically to        [facility] [address] (retail)       [address]       [place], [place]  [address]       Ph: [phone] or  [phone]       Fax: [phone]   RxID:   [id number]

## 2010-09-03 NOTE — Progress Notes (Signed)
Summary: report of foot  Phone Note Call from Patient   Caller: Patient-live Call For: Birdie Sons MD Summary of Call: Is reporting right foot w/o pain or redness since on the pills, but does have slight swelling remaining.  Asking if should stay on the pills and if so, for how long. Initial call taken by: Gladis Riffle, RN,  September 09, 2009 12:19 PM  Follow-up for Phone Call        stay on for 3 more days Follow-up by: Birdie Sons MD,  September 09, 2009 3:16 PM  Additional Follow-up for Phone Call Additional follow up Details #1::        Patient notified.  Additional Follow-up by: Gladis Riffle, RN,  September 09, 2009 4:40 PM

## 2010-09-03 NOTE — Progress Notes (Signed)
Summary: talk to nurse---had to cancel appt-  Phone Note Call from Patient   Caller: Patient Call For: [name] Summary of Call: pt would like to talk to nurse about her condition and why she cancelled appt. Initial call taken by: Rickard Patience,  May 06, 2010 9:00 AM  Follow-up for Phone Call        called and spoke with pt. pt states this is just an FYI for PW.   she was scheduled to see PW tomorrow but had to cancel d/t "gout flare up."  Pt states she did reschedule to see PW Oct 19.  Pt has finished abx that she was told to take - see phone note from 04/28/2010 for complete details.  Pt states she is "feeling much better."  Pt denied diff breathing or cough.  Will forward message to PW as an FYI.  Aundra Millet Reynolds LPN  May 06, 2010 11:01 AM   Additional Follow-up for Phone Call Additional follow up Details #1::        ok  for ov 10/19 Additional Follow-up by: Storm Frisk MD,  May 06, 2010 11:03 AM

## 2010-09-03 NOTE — Progress Notes (Signed)
Summary: FYI  Phone Note Call from Patient   Caller: Patient Call For: Birdie Sons MD Summary of Call: Pt is still concerned about her muscle pains from Colcrys, but is getting better.  Wanted Dr. Cato Mulligan to know. Initial call taken by: Lynann Beaver CMA,  January 28, 2010 3:12 PM

## 2010-09-03 NOTE — Assessment & Plan Note (Signed)
Summary: FU ON FOOT PAIN/NJR   Vital Signs:  Patient profile:   77 year old female Weight:      150 pounds Temp:     97.7 degrees F Pulse rate:   88 / minute Resp:     12 per minute BP sitting:   124 / 64  (left arm)  Vitals Entered By: Gladis Riffle, RN (September 04, 2009 9:59 AM) CC: FU foot, left toe better but right foot painful since two days after visit Is Patient Diabetic? No   CC:  FU foot and left toe better but right foot painful since two days after visit.  History of Present Illness: acutely swollen right MTP joint pain, red, swelling painful to walk,  painful to apply any pressure no fever or chills no trauma no associated sxs pain: 8/10  2nd problem:---cellulitis toe---much improved left 3rd toe  All other systems reviewed and were negative   Preventive Screening-Counseling & Management  Alcohol-Tobacco     Smoking Status: quit > 6 months     Year Quit: 1998  Current Medications (verified): 1)  Advair Diskus 250-50 Mcg/dose Misc (Fluticasone-Salmeterol) .... Inhale 1 Puff As Directed Twice A Day 2)  Dyazide 37.5-25 Mg Caps (Triamterene-Hctz) .... Take 1 Capsule By Mouth Every Morning 3)  Nasonex 50 Mcg/act  Susp (Mometasone Furoate) .... Two Puff Once Daily As Needed 4)  Centrum Silver   Tabs (Multiple Vitamins-Minerals) .... Once Daily 5)  Proair Hfa 108 (90 Base) Mcg/act Aers (Albuterol Sulfate) .... As Needed 6)  Benicar 40 Mg Tabs (Olmesartan Medoxomil) .... Once Daily 7)  Advil 200 Mg Tabs (Ibuprofen) .... As Needed  Allergies: 1)  ! Asa  Physical Exam  General:  Well-developed,well-nourished,in no acute distress; alert,appropriate and cooperative throughout examination Head:  Normocephalic and atraumatic without obvious abnormalities. No apparent alopecia or balding. Msk:  red , swollen , painful right MTP joint   Impression & Recommendations:  Problem # 1:  GOUT (ICD-274.9)  stop dyazide  Orders: Prescription Created Electronically  [phone])  Problem # 2:  HYPERTENSION (ICD-401.9) stop dyazide The following medications were removed from the medication list:    Dyazide 37.5-25 Mg Caps (Triamterene-hctz) .Marland Kitchen... Take 1 capsule by mouth every morning Her updated medication list for this problem includes:    Benicar 40 Mg Tabs (Olmesartan medoxomil) ..... Once daily  Complete Medication List: 1)  Advair Diskus 250-50 Mcg/dose Misc (Fluticasone-salmeterol) .... Inhale 1 puff as directed twice a day 2)  Nasonex 50 Mcg/act Susp (Mometasone furoate) .... Two puff once daily as needed 3)  Centrum Silver Tabs (Multiple vitamins-minerals) .... Once daily 4)  Proair Hfa 108 (90 Base) Mcg/act Aers (Albuterol sulfate) .... As needed 5)  Benicar 40 Mg Tabs (Olmesartan medoxomil) .... Once daily 6)  Advil 200 Mg Tabs (Ibuprofen) .... As needed 7)  Diclofenac Sodium 75 Mg Tbec (Diclofenac sodium) .Marland Kitchen.. 1 by mouth daily. take with food take for 5 days and then stop Prescriptions: DICLOFENAC SODIUM 75 MG TBEC (DICLOFENAC SODIUM) 1 by mouth daily. take with food take for 5 days and then stop  #30 x 0   Entered and Authorized by:   Birdie Sons MD   Signed by:   Birdie Sons MD on 09/04/2009   Method used:   Electronically to        [facility] [address] (retail)       [address]       [facility]  [place], [place]  [address]       Ph: [phone] or [phone]       Fax: [phone]   RxID:   [id number]

## 2010-09-03 NOTE — Assessment & Plan Note (Signed)
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 Jonne Ply  Past History:  Past Medical History: Last updated: 08/27/2008 Allergic rhinitis COPD stage II Golds  Hyperlipidemia Hypertension Osteoporosis  Past Surgical History: Last updated: Dec 23, 2006 D and C fractured arm--surgical repair thyroglossal cyst  Family History: Last updated: 12-23-06 Mother deceased heart disease-dm Family History Diabetes 1st degree relative-mother father-65 lung CA Family History Lung cancer  Social History: Last updated: 12-23-2006 Former Smoker Alcohol use-no Regular exercise-no  Risk Factors: Exercise: no (Dec 23, 2006)  Risk Factors: Smoking Status: quit > 6 months (09/04/2009)  Review of Systems       All other systems reviewed and were negative   Physical Exam  General:  Well-developed,well-nourished,in no acute distress; alert,appropriate and cooperative throughout examination Head:  normocephalic and atraumatic.   Eyes:  pupils equal and pupils round.   Neck:  No deformities, masses, or tenderness noted. Lungs:  Normal respiratory effort, chest expands symmetrically. Lungs are clear to auscultation, no  crackles or wheezes. Abdomen:  Bowel sounds positive,abdomen soft and non-tender without masses, organomegaly or hernias noted. Msk:  red , swollen , painful right MTP joint Pulses:  R radial normal and L radial normal.   Extremities:  trace edema left foot trace-1+ edema right foot no ankle or leg edema   Impression & Recommendations:  Problem # 1:  GOUT (ICD-274.9) resolved  Problem # 2:  HYPERTENSION (ICD-401.9) will continue to follow off of diuretic (gout) Her updated medication list for this problem includes:    Benicar 40 Mg Tabs (Olmesartan medoxomil) ..... Once daily  BP today: 142/80 Prior BP: 124/64 (09/04/2009)  Labs Reviewed: K+: 4.0 (06/13/2009) Creat: : 1.0 (06/13/2009)   Chol: 207 (09/14/2007)   HDL: 54.0 (09/14/2007)   LDL: DEL (09/14/2007)   TG: 111 (09/14/2007)  Problem # 3:  EDEMA (ICD-782.3) limited to feet only given limited extent of problem I am not inclined to treat at all or further investigate I've encouraged her to be active and call me for any worsening of sxs  Complete Medication List: 1)  Advair Diskus 250-50 Mcg/dose Misc (Fluticasone-salmeterol) .... Inhale 1 puff as directed twice a day 2)  Nasonex 50 Mcg/act Susp (Mometasone furoate) .... Two puff once daily as needed 3)  Centrum Silver Tabs (Multiple vitamins-minerals) .... Once daily 4)  Proair Hfa 108 (90 Base) Mcg/act Aers (Albuterol sulfate) .... As needed 5)  Benicar 40 Mg Tabs (Olmesartan medoxomil) .... Once daily 6)  Advil 200 Mg Tabs (Ibuprofen) .... As needed  Patient Instructions: 1)  Please schedule a follow-up appointment in 1 month. Prescriptions: BENICAR  40 MG TABS (OLMESARTAN MEDOXOMIL) once daily  #30 x 6   Entered by:   Gladis Riffle, RN   Authorized by:   Birdie Sons MD   Signed by:   Gladis Riffle, RN on 09/25/2009   Method used:   Electronically to        [facility] [address] (retail)       [address]       [place], [place]  [address]       Ph: [phone] or [phone]       Fax: [phone]   RxID:   [id number]

## 2010-09-03 NOTE — Progress Notes (Signed)
Summary: Pt req lab results. Pls call.  Phone Note Call from Patient Call back at Home Phone [phone]   Caller: Patient Summary of Call: Pt called and is req lab results. Please call back at earliest convenience.     Initial call taken by: Lucy Antigua,  January 24, 2010 3:28 PM  Follow-up for Phone Call        Pt notified of normal uric acid. Follow-up by: Gladis Riffle, RN,  January 24, 2010 4:10 PM

## 2010-09-03 NOTE — Assessment & Plan Note (Signed)
Summary: recheck ankle/et   Vital Signs:  Patient profile:   77 year old female Weight:      148 pounds BMI:     30.00 Temp:     98.3 degrees F oral Pulse rate:   80 / minute Pulse rhythm:   regular Resp:     12 per minute BP sitting:   140 / 62  (left arm) Cuff size:   regular  Vitals Entered By: Gladis Riffle, RN (Dec 06, 2009 9:49 AM) CC: FU, states toe still draining--ankle continues to swell when gets up on feet--BP 120/63-144/62 at home Is Patient Diabetic? No   Primary Care Provider:  Birdie Sons MD  CC:  FU and states toe still draining--ankle continues to swell when gets up on feet--BP 120/63-144/62 at home.  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 Inhale 1 Puff As Directed Twice A Day 2)  Nasonex 50 Mcg/act  Susp (Mometasone Furoate) .... Two Puff Once Daily As Needed 3)  Centrum Silver   Tabs (Multiple Vitamins-Minerals) .... Once Daily 4)  Proair Hfa 108 (90 Base) Mcg/act Aers (Albuterol Sulfate) .... As Needed 5)  Benicar 40 Mg Tabs (Olmesartan Medoxomil) .... Once Daily 6)  Advil 200 Mg Tabs (Ibuprofen) .... As Needed 7)  Labetalol Hcl 200 Mg Tabs (Labetalol Hcl) .Marland Kitchen.. 1 Tab Twice Daily 8)  Furosemide 20 Mg Tabs (Furosemide) .... Take One Tablet By Mouth As  Needed.  Allergies: 1)  ! Jonne Ply  Past History:  Past Medical History: Last updated: 08/27/2008 Allergic rhinitis COPD stage II Golds  Hyperlipidemia Hypertension Osteoporosis  Past Surgical History: Last updated: 2007-01-08 D and C fractured arm--surgical repair thyroglossal cyst  Family History: Last updated: 01/08/07 Mother deceased heart disease-dm Family History Diabetes 1st degree relative-mother father-65 lung CA Family History Lung cancer  Social History: Last updated: 01/08/07 Former Smoker Alcohol use-no Regular exercise-no  Risk Factors: Exercise: no (01-08-2007)  Risk Factors: Smoking Status: quit > 6 months (12/06/2009)  Review of Systems       All other systems reviewed and were negative   Physical Exam  General:  alert and well-developed.   Head:  normocephalic and atraumatic.   Eyes:  pupils equal and pupils round.   Ears:  R ear normal and L ear normal.   Neck:  No deformities, masses, or tenderness noted. Lungs:  normal respiratory effort and no intercostal retractions.   Heart:  normal rate and regular rhythm.   Abdomen:  Bowel sounds positive,abdomen soft and non-tender without masses, organomegaly or hernias noted. Msk:  No deformity or scoliosis noted of thoracic or lumbar spine.   Pulses:  R radial normal and L radial normal.   Neurologic:  cranial nerves II-XII intact and gait normal.     Impression & Recommendations:  Problem #  1:  HYPERTENSION (ICD-401.9)  BPs at home are controlled given furosemide by CV--ok to use as needed (note potential of furosemide worsening gout) Her updated medication list for this problem includes:    Benicar 40 Mg Tabs (Olmesartan medoxomil) ..... Once daily    Labetalol Hcl 200 Mg Tabs (Labetalol hcl) .Marland Kitchen... 1 tab twice daily    Furosemide 20 Mg Tabs (Furosemide) .Marland Kitchen... Take one tablet by mouth as needed.  BP today: 140/62 Prior BP: 154/80 (11/20/2009)  Labs Reviewed: K+: 4.0  (06/13/2009) Creat: : 1.0 (06/13/2009)   Chol: 207 (09/14/2007)   HDL: 54.0 (09/14/2007)   LDL: DEL (09/14/2007)   TG: 111 (09/14/2007)  Orders: TLB-BMP (Basic Metabolic Panel-BMET) (80048-METABOL)  Problem # 2:  GOUT (ICD-274.9)  she has a documented subcutaneous tophi as cause of 3rd toe abnormality not healing, refer to dr Lestine Box  Orders: Orthopedic Surgeon Referral (Ortho Surgeon) TLB-Uric Acid, Blood (84550-URIC)  Problem # 3:  SVT PROBABLY AV NODAL REENTRY (ICD-427.0) followed by dr Graciela Husbands Her updated medication list for this problem includes:    Labetalol Hcl 200 Mg Tabs (Labetalol hcl) .Marland Kitchen... 1 tab twice daily  Problem # 4:  EDEMA (ICD-782.3) minimal I have recomended minimal (if ever) use of furosemide but she well use it as needed  Her updated medication list for this problem includes:    Furosemide 20 Mg Tabs (Furosemide) .Marland Kitchen... Take one tablet by mouth as needed.  Complete Medication List: 1)  Advair Diskus 250-50 Mcg/dose Misc (Fluticasone-salmeterol) .... Inhale 1 puff as directed twice a day 2)  Nasonex 50 Mcg/act Susp (Mometasone furoate) .... Two puff once daily as needed 3)  Centrum Silver Tabs (Multiple vitamins-minerals) .... Once daily 4)  Proair Hfa 108 (90 Base) Mcg/act Aers (Albuterol sulfate) .... As needed 5)  Benicar 40 Mg Tabs (Olmesartan medoxomil) .... Once daily 6)  Advil 200 Mg Tabs (Ibuprofen) .... As needed 7)  Labetalol Hcl 200 Mg Tabs (Labetalol hcl) .Marland Kitchen.. 1 tab twice daily 8)  Furosemide 20 Mg Tabs (Furosemide) .... Take one tablet by mouth as needed.  Patient Instructions: 1)  Please schedule a follow-up appointment in 3 months.

## 2010-09-03 NOTE — Assessment & Plan Note (Signed)
Summary: 1 wk per dr burchette/njr   Vital Signs:  Patient profile:   77 year old female Weight:      151 pounds Temp:     97.7 degrees F Pulse rate:   86 / minute Resp:     14 per minute BP sitting:   138 / 60  (left arm)  Vitals Entered By: Gladis Riffle, RN (August 09, 2009 10:14 AM)   History of Present Illness: still has sore toe with some drainiage see dr. 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 Asa  Comments:  Nurse/Medical Assistant: 1 week FU from foot pain , seen by  Dr Shaaron Adler has not returned, discuss diltiazem  The patient's medications were reviewed with the patient's parent and were updated in the Medication and Allergy Lists. Gladis Riffle, RN (August 09, 2009 10:16 AM)  Past History:  Past Medical History: Last updated: 08/27/2008 Allergic rhinitis COPD stage II Golds  Hyperlipidemia Hypertension Osteoporosis  Past Surgical History: Last updated: 01/09/07 D and C fractured arm--surgical repair thyroglossal cyst  Family History: Last updated: 2007/01/09 Mother deceased heart disease-dm Family History Diabetes 1st degree relative-mother father-65 lung CA Family  History Lung cancer  Social History: Last updated: 01/09/07 Former Smoker Alcohol use-no Regular exercise-no  Risk Factors: Exercise: no (01-09-2007)  Risk Factors: Smoking Status: quit > 6 months (08/09/2009)  Review of Systems       All other systems reviewed and were negative   Physical Exam  General:  Well-developed,well-nourished,in no acute distress; alert,appropriate and cooperative throughout examination Head:  Normocephalic and atraumatic without obvious abnormalities. No apparent alopecia or balding. Ears:  R ear normal and L ear normal.   Neck:  No deformities, masses, or tenderness noted. Abdomen:  Bowel sounds positive,abdomen soft and non-tender without masses, organomegaly or hernias noted. Msk:  No deformity or scoliosis noted of thoracic or lumbar spine.   Pulses:  R radial normal and L radial normal.   dp pulse intact bilaterally Neurologic:  cranial nerves II-XII intact and gait normal.   Skin:  small amount of drainage from 2nd toe   Impression & Recommendations:  Problem # 1:  UNSPECIFIED CELLULITIS AND ABSCESS OF TOE (ICD-681.10) contrinue warm soak change abx???doxycycline The following medications were removed from the medication list:    Cephalexin 500 Mg Caps (Cephalexin) ..... One by mouth three times a day for 10 days Her updated medication list for this problem includes:    Doxycycline Hyclate 100 Mg Caps (Doxycycline hyclate) .Marland Kitchen... Take 1 tab twice a day  Complete Medication List: 1)  Advair Diskus 250-50 Mcg/dose Misc (Fluticasone-salmeterol) .... Inhale 1 puff as directed twice a day 2)  Dyazide 37.5-25 Mg  Caps (Triamterene-hctz) .... Take 1 capsule by mouth every morning 3)  Nasonex 50 Mcg/act Susp (Mometasone furoate) .... Two puff once daily as needed 4)  Centrum Silver Tabs (Multiple vitamins-minerals) .... Once daily 5)  Proair Hfa 108 (90 Base) Mcg/act Aers (Albuterol sulfate) .... As needed 6)  Diltiazem Hcl Er Beads 180 Mg  Xr24h-cap (Diltiazem hcl er beads) .... Once daily 7)  Doxycycline Hyclate 100 Mg Caps (Doxycycline hyclate) .... Take 1 tab twice a day  Patient Instructions: 1)  1-2 weeks  Prescriptions: DOXYCYCLINE HYCLATE 100 MG CAPS (DOXYCYCLINE HYCLATE) Take 1 tab twice a day  #20 x 0   Entered and Authorized by:   Birdie Sons MD   Signed by:   Birdie Sons MD on 08/09/2009   Method used:   Electronically to        [facility] [address] (retail)       [address]       [place], [place]  [address]       Ph: [phone] or [phone]       Fax: [phone]   RxID:   [id number]

## 2010-09-03 NOTE — Assessment & Plan Note (Signed)
Summary: ONE MTH ROV // RS   Vital Signs:  Patient profile:   77 year old female Weight:      151 pounds Temp:     98.2 degrees F oral Pulse rate:   70 / minute BP sitting:   154 / 82  (left arm) Cuff size:   regular  Vitals Entered By: Alfred Levins, CMA (May 26, 2010 9:47 AM) CC: f/u on gout   Primary Care Lizzette Carbonell:  Birdie Sons MD  CC:  f/u on gout.  History of Present Illness: gout flare--left foot around ankle--- resolved when increased prednisone to 10 mg---she continues on that dose and allopurinol.   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 Inhale 1 Puff As Directed Twice A Day 2)  Centrum Silver   Tabs (Multiple Vitamins-Minerals) .... Once Daily 3)  Proair Hfa 108 (90 Base) Mcg/act Aers (Albuterol Sulfate) .... As Needed 4)  Benicar 40 Mg Tabs (Olmesartan Medoxomil) .... Once Daily 5)  Labetalol Hcl 200 Mg Tabs (Labetalol Hcl) .Marland Kitchen.. 1 Tab Twice Daily 6)  Furosemide 20 Mg Tabs (Furosemide) .... Take One Tablet By Mouth As Needed. 7)  Allopurinol 300 Mg Tabs (Allopurinol) .... Take 1 Tablet By Mouth Once A Day 8)  Prednisone 10 Mg  Tabs (Prednisone) .... One By Mouth Daily.  Allergies (verified): 1)  ! Asa 2)  ! * Colcrys  Physical Exam  General:  alert and well-developed.   Head:  normocephalic and atraumatic.   Eyes:  pupils equal and pupils round.   Ears:  R ear normal and L ear normal.   Neck:  No deformities, masses, or tenderness noted. Lungs:  normal respiratory effort and no  intercostal retractions.   Heart:  normal rate and regular rhythm.   Abdomen:  soft and non-tender.   Msk:  heberden's and bouchard's nodes Neurologic:  alert & oriented X3 and cranial nerves II-XII intact.   Cervical Nodes:  No lymphadenopathy noted   Impression & Recommendations:  Problem # 1:  POLYMYALGIA RHEUMATICA (ICD-725) back on 10 mg prednisone (mostly for gout)  Problem # 2:  GOUT (ICD-274.9) continue prednisone Her updated medication list for this problem includes:    Allopurinol 300 Mg Tabs (Allopurinol) .Marland Kitchen... Take 1 tablet by mouth once a day  Problem # 3:  HYPERTENSION (ICD-401.9) will monitor see me 3 weeks  Her updated medication list for this problem includes:    Benicar 40 Mg Tabs (Olmesartan medoxomil) ..... Once daily    Labetalol Hcl 200 Mg Tabs (Labetalol hcl) .Marland Kitchen... 1 tab twice daily    Furosemide 20 Mg Tabs (Furosemide) .Marland Kitchen... Take one tablet by mouth as needed.  BP today: 154/82 Prior BP: 120/80 (04/25/2010)  Labs Reviewed: K+: 4.3 (12/06/2009) Creat: : 0.9 (12/06/2009)   Chol: 207 (09/14/2007)   HDL: 54.0 (09/14/2007)   LDL: DEL (09/14/2007)   TG: 111 (09/14/2007)  Complete Medication List: 1)  Advair Diskus 250-50 Mcg/dose Misc (Fluticasone-salmeterol) .... Inhale 1 puff as directed twice a day 2)  Centrum Silver Tabs (Multiple vitamins-minerals) .... Once daily 3)  Proair Hfa 108 (90 Base) Mcg/act Aers (Albuterol sulfate) .... As needed 4)  Benicar 40 Mg Tabs (Olmesartan medoxomil) .... Once daily 5)  Labetalol Hcl 200 Mg Tabs (Labetalol hcl) .Marland Kitchen.. 1 tab twice daily 6)  Furosemide 20 Mg Tabs (Furosemide) .... Take one tablet by mouth as needed. 7)  Allopurinol 300 Mg Tabs (Allopurinol) .... Take 1 tablet by mouth once a day 8)  Prednisone 10 Mg Tabs (Prednisone) .... One by mouth daily.  Patient Instructions: 1)  see me 3 weeks f/u gout   Orders Added: 1)  Est. Patient Level IV [[address]]

## 2010-09-03 NOTE — Progress Notes (Signed)
Summary: REQ FOR RETURN CALL (QUESTION CONCERNING LABWRK)  Phone Note Call from Patient   Caller: Patient  [phone] Reason for Call: Talk to Nurse, Talk to Doctor Summary of Call: Pt called in to speak with Alvino Chapel, RN and obtain information on recent labwrk...Marland KitchenMarland Kitchen Pt can be reached at [phone].  Initial call taken by: Debbra Riding,  November 05, 2009 9:41 AM  Follow-up for Phone Call        Pt is calling again for lab results. Follow-up by: Lynann Beaver CMA,  November 05, 2009 12:52 PM  Additional Follow-up for Phone Call Additional follow up Details #1::        Pt informed specimaen not usable as sent so no lab results.  continues to have pain, reddness, and drainage.  Appt made for 4/11. Additional Follow-up by: Gladis Riffle, RN,  November 05, 2009 4:05 PM

## 2010-09-03 NOTE — Assessment & Plan Note (Signed)
Summary: 1 MONTH ROV/NJR   Vital Signs:  Patient profile:   77 year old female Weight:      150 pounds Temp:     97.8 degrees F oral Pulse rate:   880 / minute Pulse rhythm:   regular BP sitting:   162 / 84  (left arm) Cuff size:   regular  Vitals Entered By: Kern Reap CMA Duncan Dull) (October 25, 2009 11:54 AM) CC: follow-up visit Is Patient Diabetic? No   CC:  follow-up visit.  History of Present Illness: f/u  she has had a non healing area on toe---thought to be gout---? 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 Jonne Ply  Past History:  Past Medical History: Last updated: 08/27/2008 Allergic rhinitis COPD stage II Golds  Hyperlipidemia Hypertension Osteoporosis  Past Surgical History: Last updated: 26-Dec-2006 D and C fractured arm--surgical repair thyroglossal cyst  Family History: Last updated: 12-26-2006 Mother deceased heart disease-dm Family History Diabetes 1st degree relative-mother father-65 lung CA Family History Lung cancer  Social History: Last updated: 2006/12/26 Former Smoker Alcohol use-no Regular exercise-no  Risk Factors: Exercise: no (2006-12-26)  Risk Factors: Smoking Status: quit > 6 months (09/04/2009)  Review of Systems       All other systems reviewed and were negative   Physical Exam  General:  Well-developed,well-nourished,in no acute distress; alert,appropriate and cooperative throughout examination Head:  normocephalic and atraumatic.    Neck:  No deformities, masses, or tenderness noted. Lungs:  normal respiratory effort and no intercostal retractions.   Heart:  normal rate and regular rhythm.   Abdomen:  Bowel sounds positive,abdomen soft and non-tender without masses, organomegaly or hernias noted. Msk:  No deformity or scoliosis noted of thoracic or lumbar spine.   Skin:  small area 4th toe---open with white exudate no significant erythema   Impression & Recommendations:  Problem # 1:  GOUT (ICD-274.9)  ? if 4th toe---"tophaceous"  Orders: T- * Misc. 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 1 tab twice daily  Problem # 4:  HYPERTENSION (ICD-401.9) home bps 140s/70s Her updated medication list for this problem includes:    Benicar 40 Mg Tabs (Olmesartan medoxomil) ..... Once daily    Labetalol Hcl 200 Mg Tabs (Labetalol hcl) .Marland Kitchen... 1 tab twice daily  BP today: 162/84 Prior BP: 142/80 (09/20/2009)  Labs Reviewed: K+: 4.0 (06/13/2009) Creat: : 1.0 (06/13/2009)   Chol: 207 (09/14/2007)   HDL: 54.0 (09/14/2007)   LDL: DEL (09/14/2007)   TG: 111 (09/14/2007)  Complete Medication List: 1)  Advair Diskus 250-50 Mcg/dose Misc (Fluticasone-salmeterol) .... Inhale 1 puff as directed twice a day 2)  Nasonex 50 Mcg/act Susp (Mometasone furoate) .... Two puff once daily as needed 3)  Centrum Silver Tabs (Multiple vitamins-minerals) .... Once daily 4)  Proair  Hfa 108 (90 Base) Mcg/act Aers (Albuterol sulfate) .... As needed 5)  Benicar 40 Mg Tabs (Olmesartan medoxomil) .... Once daily 6)  Advil 200 Mg Tabs (Ibuprofen) .... As needed 7)  Labetalol Hcl 200 Mg Tabs (Labetalol hcl) .Marland Kitchen.. 1 tab twice daily  Patient Instructions: 1)  Please schedule a follow-up appointment in 1  month. Prescriptions: LABETALOL HCL 200 MG TABS (LABETALOL HCL) 1 tab twice daily  #180 x 3   Entered and Authorized by:   Birdie Sons MD   Signed by:   Birdie Sons MD on 10/25/2009   Method used:   Electronically to        [facility] [address] (retail)       [address]       [place], [place]  [address]       Ph: [phone] or [phone]       Fax: [phone]   RxID:   [id number]

## 2010-09-03 NOTE — Assessment & Plan Note (Signed)
Summary: MUSCULAR ACHES & PAINS / MEDICATION CONCERNS // RS   Vital Signs:  Patient profile:   77 year old female Weight:      152 pounds Temp:     98.1 degrees F oral Pulse rate:   90 / minute Pulse rhythm:   regular Resp:     12 per minute BP sitting:   142 / 76  (left arm) Cuff size:   regular  Vitals Entered By: Gladis Riffle, RN (January 22, 2010 9:14 AM) CC: c/o myalgia since started allopurinol, called dr Lestine Box and told to DC, stopped on 01/17/10--toe was feeling better on the med and now spreading pain to next toe Is Patient Diabetic? No   Primary Care Provider:  Birdie Sons MD  CC:  c/o myalgia since started allopurinol, called dr Lestine Box and told to DC, and stopped on 01/17/10--toe was feeling better on the med and now spreading pain to next toe.  History of Present Illness: she describes 1-2 weeks of diffuse achiness. she wonders whether related to colcrys---stopped colcrys but symptoms have persisted.  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 Inhale 1 Puff As Directed Twice A Day 2)  Nasonex 50 Mcg/act  Susp (Mometasone Furoate) .... Two Puff Once Daily As Needed 3)  Centrum Silver   Tabs (Multiple Vitamins-Minerals) .... Once Daily 4)  Proair Hfa 108 (90 Base) Mcg/act Aers (Albuterol Sulfate) .... As Needed 5)  Benicar 40 Mg Tabs (Olmesartan Medoxomil) .... Once  Daily 6)  Advil 200 Mg Tabs (Ibuprofen) .... As Needed 7)  Labetalol Hcl 200 Mg Tabs (Labetalol Hcl) .Marland Kitchen.. 1 Tab Twice Daily 8)  Furosemide 20 Mg Tabs (Furosemide) .... Take One Tablet By Mouth As Needed.  Allergies: 1)  ! Asa 2)  ! Allopurinol (Allopurinol)  Past History:  Past Medical History: Last updated: 12/20/2009 Allergic rhinitis COPD stage II Golds  Hyperlipidemia Hypertension Osteoporosis Gout   Past Surgical History: Last updated: 12/27/06 D and C fractured arm--surgical repair thyroglossal cyst  Family History: Last updated: 12-27-06 Mother deceased heart disease-dm Family History Diabetes 1st degree relative-mother father-65 lung CA Family History Lung cancer  Social History: Last updated: 27-Dec-2006 Former Smoker Alcohol use-no Regular exercise-no  Risk Factors: Exercise: no (12/27/06)  Risk Factors: Smoking Status: quit > 6 months (01/22/2010)  Physical Exam  General:  alert and well-developed.   Head:  normocephalic and atraumatic.   Eyes:  pupils equal and pupils round.   Ears:  R ear normal and L ear normal.   Neck:  No deformities, masses, or tenderness noted. Chest Wall:  No deformities, masses, or tenderness noted. Lungs:  Normal respiratory effort, chest expands symmetrically. Lungs are clear to auscultation, no crackles or wheezes. Heart:  normal rate and regular rhythm.   Abdomen:  soft and non-tender.   Msk:  no joint swelling  Skin:  no rash Cervical Nodes:  no anterior cervical  adenopathy and no posterior cervical adenopathy.     Impression & Recommendations:  Problem # 1:  UNSPECIFIED MYALGIA AND MYOSITIS (ICD-729.1)  she gives a good story for PMR will check labs doubt related to meds or gout  Her updated medication list for this problem includes:    Advil 200 Mg Tabs (Ibuprofen) .Marland Kitchen... As needed  Orders: Sedimentation Rate, non-automated ([address]) Venipuncture ([address])  Complete Medication List: 1)  Advair  Diskus 250-50 Mcg/dose Misc (Fluticasone-salmeterol) .... Inhale 1 puff as directed twice a day 2)  Nasonex 50 Mcg/act Susp (Mometasone furoate) .... Two puff once daily as needed 3)  Centrum Silver Tabs (Multiple vitamins-minerals) .... Once daily 4)  Proair Hfa 108 (90 Base) Mcg/act Aers (Albuterol sulfate) .... As needed 5)  Benicar 40 Mg Tabs (Olmesartan medoxomil) .... Once daily 6)  Advil 200 Mg Tabs (Ibuprofen) .... As needed 7)  Labetalol Hcl 200 Mg Tabs (Labetalol hcl) .Marland Kitchen.. 1 tab twice daily 8)  Furosemide 20 Mg Tabs (Furosemide) .... Take one tablet by mouth as needed.  Other Orders: TLB-Uric Acid, Blood (84550-URIC)  Appended Document: Orders Update     Clinical Lists Changes  Observations: Added new observation of COMMENTS2: Wynona Canes, CMA  January 22, 2010 10:22 AM   (01/22/2010 10:21)      Laboratory Results   Blood Tests   Date/Time Recieved: January 22, 2010 10:21 AM  Date/Time Reported: January 22, 2010 10:21 AM   SED rate: 40  Comments: Wynona Canes, CMA  January 22, 2010 10:22 AM      Appended Document: MUSCULAR ACHES & PAINS / MEDICATION CONCERNS // RS call patient.  she may have polymyalgia rheumatica start prednisone 10 mg 2 by mouth once daily for one week and then 1 by mouth once daily (#100/0 refills) . See me in 2 weeks

## 2010-09-03 NOTE — Progress Notes (Signed)
Summary: REQ FOR RETURN CALL (REF GOUT SXS)  Phone Note Call from Patient   Caller: Patient Summary of Call: Pt called to speak with Dr Cato Mulligan or nurse.....Marland KitchenMarland KitchenPt states that she woke up during the night with her L foot hurting.... believes it is her gout "acting up"..... Pt was offered OV but adv she can't come in due to transportation issues.... Pt would like to speak with a Dr Cato Mulligan or nurse about her sxs.... Pt can be reached at [phone].   Initial call taken by: Debbra Riding,  May 06, 2010 9:06 AM  Follow-up for Phone Call        Painful big toe and side of foot with some swelling.  feels like gout. Taking 10 mg of Prednisone 1/2 daily and Allopurinol 300 mg one by mouth daily.  [facility]) Follow-up by: Lynann Beaver CMA,  May 06, 2010 1:11 PM  Additional Follow-up for Phone Call Additional follow up Details #1::        increase prednisone to 30 mg by mouth once daily for 4 days and then decrease to 10 mg by mouth qd Additional Follow-up by: Birdie Sons MD,  May 06, 2010 3:06 PM    Additional Follow-up for Phone Call Additional follow up Details #2::    Notified pt. Follow-up by: Lynann Beaver CMA,  May 06, 2010 3:09 PM  New/Updated Medications: PREDNISONE 10 MG  TABS (PREDNISONE) one by mouth daily.

## 2010-09-03 NOTE — Assessment & Plan Note (Signed)
Summary: Pulmonary OV   CC:  4 mo follow up.  states breathing is the same-no better and no worse.  Marland Kitchen  History of Present Illness: COPD f/u:       This is an 77 years old female with advanced COPD Golds Stage II    February 08, 2009: Doing well with no worsening in dyspnea.  No cough or chest pain.  No new pulm issues. Nasal congestion is tolerable Pt denies any significant sore throat, nasal congestion or excess secretions, fever, chills, sweats, unintended weight loss, pleurtic or exertional chest pain, orthopnea PND, or leg swelling   April 29, 2009 9:27 AMT The pt did well in [place].  No real issues just sl wheeze but ok after advair. There is no problems with dyspnea at night.  No mucous ,  no cough. No chest pain is noted.    No edema.  No other issues noted.   August 30, 2009 9:31 AM Pt had palpitations,  event recorder given,  meds switched to diltiazem then back to benicar 40mg /d Pt then had another event 40sec ,  back to normal , Had SVT on event monitor. Pt is a heavy Tea drinker Lately dyspnea is ok.  Only dyspneic with vacuuming.  No at bedtime dyspnea.  No chest pain.  Sl amount of edema in feet.  Had foot cellulitis.  Pt denies any significant sore throat, nasal congestion or excess secretions, fever, chills, sweats, unintended weight loss, pleurtic or exertional chest pain, orthopnea PND, or leg swelling Pt denies any increase in rescue therapy over baseline, denies waking up needing it or having any early am or nocturnal exacerbations of coughing/wheezing/or dyspnea.   Preventive Screening-Counseling & Management  Alcohol-Tobacco     Smoking Status: quit > 6 months  Current Medications (verified): 1)  Advair Diskus 250-50 Mcg/dose Misc (Fluticasone-Salmeterol) .... Inhale 1 Puff As Directed Twice A Day 2)  Dyazide 37.5-25 Mg Caps (Triamterene-Hctz) .... Take 1 Capsule By Mouth Every Morning 3)  Nasonex 50 Mcg/act  Susp (Mometasone Furoate) .... Two Puff Once  Daily As Needed 4)  Centrum Silver   Tabs (Multiple Vitamins-Minerals) .... Once Daily 5)  Proair Hfa 108 (90 Base) Mcg/act Aers (Albuterol Sulfate) .... As Needed 6)  Benicar 40 Mg Tabs (Olmesartan Medoxomil) .... Once Daily  Allergies (verified): 1)  ! Asa  Review of Systems  The patient denies shortness of breath with activity, shortness of breath at rest, productive cough, non-productive cough, coughing up blood, chest pain, irregular heartbeats, acid heartburn, indigestion, loss of appetite, weight change, abdominal pain, difficulty swallowing, sore throat, tooth/dental problems, headaches, nasal congestion/difficulty breathing through nose, sneezing, itching, ear ache, anxiety, depression, hand/feet swelling, joint stiffness or pain, rash, change in color of mucus, and fever.    Vital Signs:  Patient profile:   77 year old female Height:      59 inches Weight:      152.13 pounds O2 Sat:      96 % on Room air Temp:     97.3 degrees F oral Pulse rate:   95 / minute BP sitting:   130 / 72  (left arm) Cuff size:   regular  Vitals Entered By: Gweneth Dimitri RN (August 30, 2009 9:20 AM)  O2 Flow:  Room air CC: 4 mo follow up.  states breathing is the same-no better, no worse.   Comments Medications reviewed with patient Daytime contact number verified with patient. Gweneth Dimitri RN  August 30, 2009 9:20 AM  Physical Exam  Additional Exam:  Gen: Pleasant, well nourished, in no distress ENT: no lesions, no postnasal drip Neck: No JVD, no TMG, no carotid bruits Lungs: no use of accessory muscles, no dullness to percussion, clear without rales or rhonchi Cardiovascular: RRR, heart sounds normal, no murmurs or gallops, no peripheral edema Musculoskeletal: No deformities, no cyanosis or clubbing     Impression & Recommendations:  Problem # 1:  COPD (ICD-496) Assessment Unchanged Golds Stage III COPD  stable plan cont advair   Problem # 2:  SVT PROBABLY AV NODAL  REENTRY (ICD-427.0) Assessment: Unchanged SVT   improved plan reduce amount of caffeine consumed  Medications Added to Medication List This Visit: 1)  Benicar 40 Mg Tabs (Olmesartan medoxomil) .... Once daily  Complete Medication List: 1)  Advair Diskus 250-50 Mcg/dose Misc (Fluticasone-salmeterol) .... Inhale 1 puff as directed twice a day 2)  Dyazide 37.5-25 Mg Caps (Triamterene-hctz) .... Take 1 capsule by mouth every morning 3)  Nasonex 50 Mcg/act Susp (Mometasone furoate) .... Two puff once daily as needed 4)  Centrum Silver Tabs (Multiple vitamins-minerals) .... Once daily 5)  Proair Hfa 108 (90 Base) Mcg/act Aers (Albuterol sulfate) .... As needed 6)  Benicar 40 Mg Tabs (Olmesartan medoxomil) .... Once daily  Other Orders: Est. Patient Level III (16109)  Patient Instructions: 1)  No change in medications 2)  Return 4 months

## 2010-09-03 NOTE — Assessment & Plan Note (Signed)
Summary: Pulmonary OV   Primary Provider/Referring Provider:  Birdie Sons MD  CC:  4 month follow up.  Pt states breathing is doing well overall.  Only having SOB when over exerting.  Denies wheezing, chest tightness, and cough.  No complaints.  .  History of Present Illness: COPD f/u:       This is an 77 years old female with advanced COPD Golds Stage II  Dec 20, 2009 10:25 AM Pt notes gout is an issue. Dyspnea for a few days with sneezing and sl wheeze.  Otherwise ok.  No real cough.  No chest pain.  Slight amount of heartburn.  No tachycardia for now. Pt denies any significant sore throat, nasal congestion or excess secretions, fever, chills, sweats, unintended weight loss, pleurtic or exertional chest pain, orthopnea PND, or leg swelling Pt denies any increase in rescue therapy over baseline, denies waking up needing it or having any early am or nocturnal exacerbations of coughing/wheezing/or dyspnea.   Clinical Reports Reviewed:  PFT's:  12/20/2009: FEV1 %Predicted:  100.30 FEV1/FVC %Predicted:  97.60 FVC %Predicted:  102.40  12/21/2008: FEF 25/75 %Predicted:  85 FEV1 %Predicted:  94 FEV1/FVC %Predicted:  95 FVC %Predicted:  90   Preventive Screening-Counseling & Management  Alcohol-Tobacco     Smoking Status: quit > 6 months  Current Medications (verified): 1)  Advair Diskus 250-50 Mcg/dose Misc (Fluticasone-Salmeterol) .... Inhale 1 Puff As Directed Twice A Day 2)  Nasonex 50 Mcg/act  Susp (Mometasone Furoate) .... Two Puff Once Daily As Needed 3)  Centrum Silver   Tabs (Multiple Vitamins-Minerals) .... Once Daily 4)  Proair Hfa 108 (90 Base) Mcg/act Aers (Albuterol Sulfate) .... As Needed 5)  Benicar 40 Mg Tabs (Olmesartan Medoxomil) .... Once Daily 6)  Advil 200 Mg Tabs (Ibuprofen) .... As Needed 7)  Labetalol Hcl 200 Mg Tabs (Labetalol Hcl) .Marland Kitchen.. 1 Tab Twice Daily 8)  Furosemide 20 Mg Tabs (Furosemide) .... Take One Tablet By Mouth As Needed. 9)  Allopurinol  300 Mg Tabs (Allopurinol) .... One By Mouth Daily  Allergies (verified): 1)  ! Asa  Past History:  Past medical, surgical, family and social histories (including risk factors) reviewed, and no changes noted (except as noted below).  Past Medical History: Allergic rhinitis COPD stage II Golds  Hyperlipidemia Hypertension Osteoporosis Gout   Past Surgical History: Reviewed history from 12/20/2006 and no changes required. D and C fractured arm--surgical repair thyroglossal cyst  Family History: Reviewed history from 12/20/2006 and no changes required. Mother deceased heart disease-dm Family History Diabetes 1st degree relative-mother father-65 lung CA Family History Lung cancer  Social History: Reviewed history from 12/20/2006 and no changes required. Former Smoker Alcohol use-no Regular exercise-no  Review of Systems       The patient complains of shortness of breath with activity and joint stiffness or pain.  The patient denies shortness of breath at rest, productive cough, non-productive cough, coughing up blood, chest pain, irregular heartbeats, acid heartburn, indigestion, loss of appetite, weight change, abdominal pain, difficulty swallowing, sore throat, tooth/dental problems, headaches, nasal congestion/difficulty breathing through nose, sneezing, itching, ear ache, anxiety, depression, hand/feet swelling, rash, change in color of mucus, and fever.    Vital Signs:  Patient profile:   77 year old female Height:      59 inches Weight:      151 pounds BMI:     30.61 O2 Sat:      97 % on Room air Temp:     97.5 degrees F  oral Pulse rate:   86 / minute BP sitting:   140 / 72  (right arm) Cuff size:   regular  Vitals Entered By: Gweneth Dimitri RN (Dec 20, 2009 10:21 AM)  O2 Flow:  Room air CC: 4 month follow up.  Pt states breathing is doing well overall.  Only having SOB when over exerting.  Denies wheezing, chest tightness, cough.  No complaints.   Comments  Medications reviewed with patient Daytime contact number verified with patient. Gweneth Dimitri RN  Dec 20, 2009 10:21 AM    Physical Exam  Additional Exam:  Gen: Pleasant, well nourished, in no distress ENT: no lesions, no postnasal drip Neck: No JVD, no TMG, no carotid bruits Lungs: no use of accessory muscles, no dullness to percussion, clear without rales or rhonchi Cardiovascular: RRR, heart sounds normal, no murmurs or gallops, no peripheral edema Musculoskeletal: No deformities, no cyanosis or clubbing     Pulmonary Function Test Date: 12/20/2009 10:37 AM Height (in.): 60 Gender: Female  Pre-Spirometry FVC    Value: 1.71 L/min   % Pred: 102.40 % FEV1    Value: 1.21 L     Pred: 1.20 L     % Pred: 100.30 % FEV1/FVC  Value: 70.62 %     % Pred: 97.60 %  Impression & Recommendations:  Problem # 1:  COPD (ICD-496) Assessment Deteriorated  Golds Stage II COPD  stable plan cont advair   Complete Medication List: 1)  Advair Diskus 250-50 Mcg/dose Misc (Fluticasone-salmeterol) .... Inhale 1 puff as directed twice a day 2)  Nasonex 50 Mcg/act Susp (Mometasone furoate) .... Two puff once daily as needed 3)  Centrum Silver Tabs (Multiple vitamins-minerals) .... Once daily 4)  Proair Hfa 108 (90 Base) Mcg/act Aers (Albuterol sulfate) .... As needed 5)  Benicar 40 Mg Tabs (Olmesartan medoxomil) .... Once daily 6)  Advil 200 Mg Tabs (Ibuprofen) .... As needed 7)  Labetalol Hcl 200 Mg Tabs (Labetalol hcl) .Marland Kitchen.. 1 tab twice daily 8)  Furosemide 20 Mg Tabs (Furosemide) .... Take one tablet by mouth as needed. 9)  Allopurinol 300 Mg Tabs (Allopurinol) .... One by mouth daily  Other Orders: Spirometry w/Graph (94010) Est. Patient Level III (54627)  Patient Instructions: 1)  No change in medications 2)  Return in      5    months   CardioPerfect Spirometry  ID: [id number] Patient: [name], [name] DOB: Jan 02, 1920 Age: 77 Years Old Sex: Female Race: White Physician:  Shan Levans, MD Height: 59 Weight: 151 Status: Confirmed Past Medical History:  Allergic rhinitis COPD stage II Golds  Hyperlipidemia Hypertension Osteoporosis  Recorded: 12/20/2009 10:37 AM  Parameter  Measured Predicted %Predicted FVC     1.71        1.67        102.40 FEV1     1.21        1.20        100.30 FEV1%   70.62        72.32        97.60 PEF    4.09        3.29        124.40   Comments: Normal spirometry  Interpretation: Pre: FVC= 1.71L FEV1= 1.21L FEV1%= 70.6% 1.21/1.71 FEV1/FVC (12/20/2009 10:39:56 AM), Within normal limits

## 2010-09-03 NOTE — Assessment & Plan Note (Signed)
Summary: f/u 3-4 month rov/sl  Medications Added FUROSEMIDE 20 MG TABS (FUROSEMIDE) Take one tablet by mouth as needed.        Visit Type:  Follow-up Primary Provider:  Birdie Sons MD   History of Present Illness: Mrs. Blayney is seen in followup for palpitations as well as documented SVT. She has a history of hypertension we try diltiazem instead of her Benicar at her last visit. For reasons not clear this was discontinued and she was put on labetalol. Dr. Ricki Miller mother also suggested to her that she stop her caffeinated tea and this combination of events has been associated with no history of palpitations since February.  her Dyazide has been intercurrently discontinued and she is having significant problems with peripheral edema. THis was done because of gout  Current Medications (verified): 1)  Advair Diskus 250-50 Mcg/dose Misc (Fluticasone-Salmeterol) .... Inhale 1 Puff As Directed Twice A Day 2)  Nasonex 50 Mcg/act  Susp (Mometasone Furoate) .... Two Puff Once Daily As Needed 3)  Centrum Silver   Tabs (Multiple Vitamins-Minerals) .... Once Daily 4)  Proair Hfa 108 (90 Base) Mcg/act Aers (Albuterol Sulfate) .... As Needed 5)  Benicar 40 Mg Tabs (Olmesartan Medoxomil) .... Once Daily 6)  Advil 200 Mg Tabs (Ibuprofen) .... As Needed 7)  Labetalol Hcl 200 Mg Tabs (Labetalol Hcl) .Marland Kitchen.. 1 Tab Twice Daily 8)  Doxycycline Hyclate 100 Mg Tabs (Doxycycline Hyclate) .... Take 1 Tablet By Mouth Two Times A Day X 10 Ays  Allergies: 1)  ! Jonne Ply  Past History:  Past Medical History: Last updated: 08/27/2008 Allergic rhinitis COPD stage II Golds  Hyperlipidemia Hypertension Osteoporosis  Vital Signs:  Patient profile:   77 year old female Height:      59 inches Weight:      148 pounds Pulse rate:   97 / minute BP sitting:   154 / 80  (left arm)  Vitals Entered By: Laurance Flatten CMA (November 20, 2009 2:06 PM)  Physical Exam  General:  The patient was alert and oriented in no acute  distress. HEENT Normal.  Neck veins were flat, carotids were brisk.  Lungs were clear.  Heart sounds were regular without murmurs or gallops.  Abdomen was soft with active bowel sounds. There is no clubbing cyanosis2+ bilateral peripheral edema Skin Warm and dry    EKG  Procedure date:  11/20/2009  Findings:      sinus rhythm at 97 Intervals 0.14/0.07 5.36 Axis is leftward at -24 Mild ST segment changes  Impression & Recommendations:  Problem # 1:  SVT PROBABLY AV NODAL REENTRY (ICD-427.0) Her palpitations are controlled now with a combination of a limitation of caffeinated tea and the addition of labetalol. We will do nothing further at this time. We will see her as needed. Her updated medication list for this problem includes:    Labetalol Hcl 200 Mg Tabs (Labetalol hcl) .Marland Kitchen... 1 tab twice daily  Orders: EKG w/ Interpretation (93000)  Problem # 2:  HYPERTENSION (ICD-401.9) Her blood pressure remains elevated. Up titration of her labetalol may be appropriate. She is to see Dr. Cato Mulligan next week and I will defer this to him. In the interim however, because of her edema, I have suggested that she take her Dyazide and/or her newly prescribed furosemide 2 or 3 times a week. This may also have an impact on her blood pressure Her updated medication list for this problem includes:    Benicar 40 Mg Tabs (Olmesartan medoxomil) ..... Once daily  Labetalol Hcl 200 Mg Tabs (Labetalol hcl) .Marland Kitchen... 1 tab twice daily    Furosemide 20 Mg Tabs (Furosemide) .Marland Kitchen... Take one tablet by mouth as needed.  Problem # 3:  EDEMA (ICD-782.3) please see the discussion above  Patient Instructions: 1)  Your physician has recommended you make the following change in your medication: Take Furosemide 20mg  1 tablet as needed. 2)  Your physician recommends that you schedule a follow-up appointment in: as needed. Prescriptions: FUROSEMIDE 20 MG TABS (FUROSEMIDE) Take one tablet by mouth as needed.  #30 x 11    Entered by:   Optometrist BSN   Authorized by:   Nathen May, MD, [facility]   Signed by:   Gypsy Balsam RN BSN on 11/20/2009   Method used:   Electronically to        [facility] (retail)       [address]       [place], [place]  [address]       Ph: [phone] or [phone]       Fax: [phone]   RxID:   [id number]

## 2010-09-03 NOTE — Progress Notes (Signed)
Summary: FYI  Phone Note Other Incoming Call back at [phone]   Caller: tanya from [facility] Summary of Call: Called for lab clarification.  Swab they received can only be used for chlamydia and gonorrhea.  To test fluid for uric acid crystals need to put fluid in sterile container and to test for culture culture need to use aerobic/anaerobic culture tube. Initial call taken by: Gladis Riffle, RN,  October 29, 2009 8:43 AM

## 2010-09-03 NOTE — Progress Notes (Signed)
Summary: [facility] Drug Interaction re: Cipro and Prednisone  Phone Note From Pharmacy Call back at [phone] [facility]   [facility]er: [facility] [address] Summary of Call: [facility] called : Cipro. There is a drug interaction because pt is also taking Prednison. Fax has been sent re: this matter. Pt is waiting at the Pharmacy, so we need a response asap.   Initial call taken by: Lucy Antigua,  March 05, 2010 11:08 AM  Follow-up for Phone Call        ok to fill cipro per dr swords.  Pharmacist notified. Follow-up by: Gladis Riffle, RN,  March 05, 2010 11:22 AM

## 2010-09-03 NOTE — Progress Notes (Signed)
Summary: back on Benicar per Dr. Clide Cliff  Phone Note Call from Patient Call back at Home Phone [phone] Call back at [facility]   Caller: live Call For: Melanie Openshaw Summary of Call: Got a call from Dr. Wilmon Arms nurse this am to go back on the Benicar because it does such a good job on her BP/heart.  Wanted Dr. Cato Mulligan to have this info.   Should she continue the Losartan Pot that she just got #90 of and the Benicar & the other one which Dr. Cato Mulligan has the name of?     Initial call taken by: Rudy Jew, RN,  August 23, 2009 10:16 AM  Follow-up for Phone Call        no Follow-up by: Birdie Sons MD,  August 23, 2009 11:19 AM  Additional Follow-up for Phone Call Additional follow up Details #1::        Phone Call Completed Additional Follow-up by: Rudy Jew, RN,  August 23, 2009 11:27 AM

## 2010-09-03 NOTE — Progress Notes (Signed)
Summary: CALLING WITH QUESTIONS ABOUT HEART PALP AND MED    Phone Note Call from Patient Call back at Home Phone [phone]   Caller: Patient Summary of Call: PT HAVE QUESTIONS HEART PALP,AND SOME MEDICATION SHE SHOULD BE TAKING. Initial call taken by: Judie Grieve,  September 12, 2009 3:25 PM  Follow-up for Phone Call        She just wanted to know if Voltaren that Dr. Cato Mulligan put her on for gout could cause her to have more palpitation. I told her since it is a NSAID, that it should not. She has had them 2x in 2 hours which is more than normal for her. She is anxious today because her grand baby was born today. I expressed to her that can certainly increase the likelihood of her palpitations. She understands and is calming down now. She is very thankful for the info and knows to call back if this continues to be a problem for her.  Follow-up by: Duncan Dull, RN, BSN,  September 12, 2009 3:50 PM

## 2010-09-03 NOTE — Assessment & Plan Note (Signed)
Summary: left middle toe pain and drainage/et   Vital Signs:  Patient profile:   77 year old female Pulse rate:   68 / minute Pulse rhythm:   regular Resp:     12 per minute BP sitting:   120 / 68  (left arm) Cuff size:   regular  Vitals Entered By: Gladis Riffle, RN (November 11, 2009 9:31 AM) CC: recheck middle toe left foot, also redness great toe right foot and little toe left foot Is Patient Diabetic? No   CC:  recheck middle toe left foot and also redness great toe right foot and little toe left foot.  History of Present Illness: comes in for evlauation of non helaing are on 4th left toe has been inflamed and painful before, not painful currently  right great MTP joint with some swelling and pain (hx of gout)  All other systems reviewed and were negative   Preventive Screening-Counseling & Management  Alcohol-Tobacco     Smoking Status: quit > 6 months     Year Quit: 1998  Current Medications (verified): 1)  Advair Diskus 250-50 Mcg/dose Misc (Fluticasone-Salmeterol) .... Inhale 1 Puff As Directed Twice A Day 2)  Nasonex 50 Mcg/act  Susp (Mometasone Furoate) .... Two Puff Once Daily As Needed 3)  Centrum Silver   Tabs (Multiple Vitamins-Minerals) .... Once Daily 4)  Proair Hfa 108 (90 Base) Mcg/act Aers (Albuterol Sulfate) .... As Needed 5)  Benicar 40 Mg Tabs (Olmesartan Medoxomil) .... Once Daily 6)  Advil 200 Mg Tabs (Ibuprofen) .... As Needed 7)  Labetalol Hcl 200 Mg Tabs (Labetalol Hcl) .Marland Kitchen.. 1 Tab Twice Daily  Allergies: 1)  ! Jonne Ply  Past History:  Past Medical History: Last updated: 08/27/2008 Allergic rhinitis COPD stage II Golds  Hyperlipidemia Hypertension Osteoporosis  Past Surgical History: Last updated: 2007-01-06 D and C fractured arm--surgical repair thyroglossal cyst  Family History: Last updated: January 06, 2007 Mother deceased heart disease-dm Family History Diabetes 1st degree relative-mother father-65 lung CA Family History Lung  cancer  Social History: Last updated: 01/06/07 Former Smoker Alcohol use-no Regular exercise-no  Risk Factors: Exercise: no (06-Jan-2007)  Risk Factors: Smoking Status: quit > 6 months (11/11/2009)  Physical Exam  General:  alert and well-developed.   Head:  normocephalic and atraumatic.   Eyes:  pupils equal and pupils round.   Extremities:  4th left toe with some white exudate not painful  right MTP joint with some erythema, only minimal discomfort to deep palpation and movement   Impression & Recommendations:  Problem # 1:  GOUT (ICD-274.9) send exudate for evaluation of crystals/culture no treatment until after lab eval Orders: T-Crystals, Synovial Fluid ([id number]) Culture, Wound -[facility] ([id number])  Complete Medication List: 1)  Advair Diskus 250-50 Mcg/dose Misc (Fluticasone-salmeterol) .... Inhale 1 puff as directed twice a day 2)  Nasonex 50 Mcg/act Susp (Mometasone furoate) .... Two puff once daily as needed 3)  Centrum Silver Tabs (Multiple vitamins-minerals) .... Once daily 4)  Proair Hfa 108 (90 Base) Mcg/act Aers (Albuterol sulfate) .... As needed 5)  Benicar 40 Mg Tabs (Olmesartan medoxomil) .... Once daily 6)  Advil 200 Mg Tabs (Ibuprofen) .... As needed 7)  Labetalol Hcl 200 Mg Tabs (Labetalol hcl) .Marland Kitchen.. 1 tab twice daily

## 2010-09-03 NOTE — Assessment & Plan Note (Signed)
Summary: 10 day rov/njr   Vital Signs:  Patient profile:   77 year old female Weight:      150 pounds Temp:     98 degrees F Pulse rate:   68 / minute Resp:     12 per minute BP sitting:   138 / 64  (left arm)  Vitals Entered By: Gladis Riffle, RN (August 19, 2009 9:58 AM)   History of Present Illness: f/u cellulitis drainage resolved pain much improved she tolerated antibiotic  she also wonders about BP. 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 Asa  Comments:  Nurse/Medical Assistant: 10 day rov, states toe getting better and has completed antibiotic;  now states right foot swollen after sitting in car yesterday--BP last two weeks 122/61-175/68 at home (dr Graciela Husbands has put her on diltiazem, ov with klein end of march  The patient's medications and allergies were reviewed with the patient and were updated in the Medication and Allergy Lists. Gladis Riffle, RN (August 19, 2009 10:02 AM)  Past History:  Past Medical History: Last updated: 08/27/2008 Allergic rhinitis COPD stage II Golds  Hyperlipidemia Hypertension Osteoporosis  Past Surgical History: Last updated: 12-26-2006 D and C fractured arm--surgical repair thyroglossal cyst  Family History: Last updated: 2006-12-26 Mother deceased heart disease-dm Family History Diabetes 1st degree relative-mother father-65 lung CA Family History Lung cancer  Social History: Last  updated: Dec 26, 2006 Former Smoker Alcohol use-no Regular exercise-no  Risk Factors: Exercise: no (Dec 26, 2006)  Risk Factors: Smoking Status: quit > 6 months (08/19/2009)  Review of Systems       All other systems reviewed and were negative   Physical Exam  General:  Well-developed,well-nourished,in no acute distress; alert,appropriate and cooperative throughout examination Head:  Normocephalic and atraumatic without obvious abnormalities. No apparent alopecia or balding. Msk:  No deformity or scoliosis noted of thoracic or lumbar spine.   Skin:  small amount of drainage from 2nd toe..much improved with essentially no erythema   Impression & Recommendations:  Problem # 1:  UNSPECIFIED CELLULITIS AND ABSCESS OF TOE (ICD-681.10) much improved she will continue soaks keep toe covered when wearing shoes  Problem # 2:  HYPERTENSION (ICD-401.9) increase diltiazem Her updated medication list for this problem includes:    Dyazide 37.5-25 Mg Caps (Triamterene-hctz) .Marland Kitchen... Take 1 capsule by mouth every morning    Diltiazem Hcl Er Beads 180 Mg Xr24h-cap (Diltiazem hcl er beads) ..... Once daily    Losartan Potassium 50 Mg Tabs (Losartan potassium) .Marland Kitchen... Take 1 tablet by mouth once a day  Complete Medication List: 1)  Advair Diskus 250-50 Mcg/dose Misc (Fluticasone-salmeterol) .... Inhale 1 puff as directed twice a day 2)  Dyazide 37.5-25 Mg Caps (Triamterene-hctz) .... Take 1 capsule by mouth every morning 3)  Nasonex 50 Mcg/act Susp (Mometasone furoate) .... Two puff once daily as needed 4)  Centrum Silver Tabs (Multiple vitamins-minerals) .... Once daily 5)  Proair Hfa 108 (90 Base) Mcg/act Aers (Albuterol  sulfate) .... As needed 6)  Diltiazem Hcl Er Beads 180 Mg Xr24h-cap (Diltiazem hcl er beads) .... Once daily 7)  Losartan Potassium 50 Mg Tabs (Losartan potassium) .... Take 1 tablet by mouth once a day  Patient Instructions: 1)  Please schedule a follow-up appointment in 1  month. Prescriptions: LOSARTAN POTASSIUM 50 MG TABS (LOSARTAN POTASSIUM) Take 1 tablet by mouth once a day  #90 x 3   Entered and Authorized by:   Birdie Sons MD   Signed by:   Birdie Sons MD on 08/19/2009   Method used:   Electronically to        [facility] [address] (retail)       [address]       [place], [place]  [address]       Ph: [phone] or [phone]       Fax: [phone]   RxID:   [id number]

## 2010-09-03 NOTE — Progress Notes (Signed)
Summary: needs refill today please  Phone Note Refill Request Message from:  Patient on [facility] [phone]  Refills Requested: Medication #1:  PREDNISONE 10 MG  TABS one by mouth daily.. pt needs refill today please  Initial call taken by: Heron Sabins,  May 20, 2010 12:08 PM    Prescriptions: PREDNISONE 10 MG  TABS (PREDNISONE) one by mouth daily.  #30 x 1   Entered by:   Lynann Beaver CMA   Authorized by:   Birdie Sons MD   Signed by:   Lynann Beaver CMA on 05/20/2010   Method used:   Electronically to        [facility] [address] (retail)       [address]       [place], [place]  [address]       Ph: [phone] or [phone]       Fax: [phone]   RxID:   [id number]

## 2010-09-03 NOTE — Miscellaneous (Signed)
  Medications Added ALLOPURINOL 300 MG TABS (ALLOPURINOL) one by mouth daily       Clinical Lists Changes  Medications: Added new medication of ALLOPURINOL 300 MG TABS (ALLOPURINOL) one by mouth daily - Signed Rx of ALLOPURINOL 300 MG TABS (ALLOPURINOL) one by mouth daily;  #30 x 5;  Signed;  Entered by: Lynann Beaver CMA;  Authorized by: Birdie Sons MD;  Method used: Electronically to [facility] [address] [place], [place], [place]  [address], Ph: [phone] or [phone], Fax: [phone]    Prescriptions: ALLOPURINOL 300 MG TABS (ALLOPURINOL) one by mouth daily  #30 x 5   Entered by:   Lynann Beaver CMA   Authorized by:   Birdie Sons MD   Signed by:   Lynann Beaver CMA on 12/06/2009   Method used:   Electronically to        [facility] [address] (retail)       [address]       [place], [place]  [address]       Ph: [phone] or [phone]       Fax: [phone]   RxID:   [id number]

## 2010-09-03 NOTE — Assessment & Plan Note (Signed)
Summary: 3 week fup//ccm   Vital Signs:  Patient profile:   77 year old female Weight:      153 pounds O2 Sat:      97 % Temp:     98.2 degrees F oral Pulse rate:   82 / minute Pulse rhythm:   regular Resp:     16 per minute BP sitting:   160 / 70  Vitals Entered By: Lynann Beaver CMA (March 28, 2010 9:23 AM)  Serial Vital Signs/Assessments:  Time      Position  BP       Pulse  Resp  Temp     By                     144/70                         Birdie Sons MD  CC: rov Is Patient Diabetic? No Pain Assessment Patient in pain? no        Primary Care Denishia Citro:  Birdie Sons MD  CC:  rov.  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 Inhale 1 Puff As Directed Twice A Day 2)  Nasonex 50 Mcg/act  Susp (Mometasone Furoate) .... Two Puff Once Daily As Needed 3)  Centrum Silver   Tabs (Multiple Vitamins-Minerals) .... Once Daily 4)  Proair Hfa 108 (90 Base) Mcg/act Aers (Albuterol Sulfate) .... As Needed 5)  Benicar 40 Mg Tabs (Olmesartan Medoxomil) .... Once Daily 6)  Labetalol Hcl 200 Mg Tabs (Labetalol Hcl) .Marland Kitchen.. 1 Tab Twice Daily 7)  Furosemide 20 Mg Tabs (Furosemide) .... Take One Tablet By Mouth As Needed. 8)  Allopurinol 300 Mg Tabs (Allopurinol) .... Take 1 Tablet By Mouth Once A Day 9)  Prednisone 10 Mg  Tabs (Prednisone) .... Take 1 Tablet By Mouth Once A Day  Allergies (verified): 1)  ! Asa 2)  ! * Colcrys  Physical Exam  General:  alert and well-developed.   Head:  normocephalic and atraumatic.   Eyes:  pupils equal and pupils round.   Ears:  R ear normal and L ear normal.   Nose:  no external deformity.   Neck:  No deformities, masses, or  tenderness noted. Lungs:  normal respiratory effort and no intercostal retractions.   Heart:  normal rate and regular rhythm.   Abdomen:  soft and non-tender.   Skin:  turgor normal and color normal.   Psych:  normally interactive and good eye contact.     Impression & Recommendations:  Problem # 1:  POLYMYALGIA RHEUMATICA (ICD-725) DOING VERY WELL DECREASE PREDNISONE  Problem # 2:  HYPERTENSION (ICD-401.9) reasonable control continue current medications  Her updated medication list for this problem includes:    Benicar 40 Mg Tabs (Olmesartan medoxomil) ..... Once daily    Labetalol Hcl 200 Mg Tabs (Labetalol hcl) .Marland Kitchen... 1 tab twice daily    Furosemide 20 Mg Tabs (Furosemide) .Marland Kitchen... Take one tablet by mouth as needed.  Problem # 3:  HYPERLIPIDEMIA (ICD-272.4) no rx indicated Labs Reviewed: SGOT: 19 (06/13/2009)   SGPT: 24 (06/13/2009)   HDL:54.0 (09/14/2007), 50.2 (12/21/2006)  LDL:DEL (09/14/2007), DEL (12/21/2006)  Chol:207 (09/14/2007), 231 (  12/21/2006)  Trig:111 (09/14/2007), 184 (12/21/2006)  Complete Medication List: 1)  Advair Diskus 250-50 Mcg/dose Misc (Fluticasone-salmeterol) .... Inhale 1 puff as directed twice a day 2)  Nasonex 50 Mcg/act Susp (Mometasone furoate) .... Two puff once daily as needed 3)  Centrum Silver Tabs (Multiple vitamins-minerals) .... Once daily 4)  Proair Hfa 108 (90 Base) Mcg/act Aers (Albuterol sulfate) .... As needed 5)  Benicar 40 Mg Tabs (Olmesartan medoxomil) .... Once daily 6)  Labetalol Hcl 200 Mg Tabs (Labetalol hcl) .Marland Kitchen.. 1 tab twice daily 7)  Furosemide 20 Mg Tabs (Furosemide) .... Take one tablet by mouth as needed. 8)  Allopurinol 300 Mg Tabs (Allopurinol) .... Take 1 tablet by mouth once a day 9)  Prednisone 10 Mg Tabs (Prednisone) .... Take 1 tablet by mouth every other day alternating with 1/2 tablet by mouth every other day  Patient Instructions: 1)  Please schedule a follow-up appointment in 1 month. Prescriptions: PROAIR HFA  108 (90 BASE) MCG/ACT AERS (ALBUTEROL SULFATE) as needed  #1 x 1   Entered and Authorized by:   Birdie Sons MD   Signed by:   Birdie Sons MD on 03/28/2010   Method used:   Electronically to        [facility] [address] (retail)       [address]       [place], [place]  [address]       Ph: [phone] or [phone]       Fax: [phone]   RxID:   [id number]

## 2010-09-03 NOTE — Assessment & Plan Note (Signed)
Summary: 2 wk rov/njr   Vital Signs:  Patient profile:   77 year old female Weight:      148 pounds BMI:     30.00 Temp:     97.6 degrees F oral Pulse rate:   92 / minute Pulse rhythm:   irregular Resp:     12 per minute BP sitting:   110 / 68  (left arm) Cuff size:   regular  Vitals Entered By: Gladis Riffle, RN (February 21, 2010 11:22 AM) CC: 2 week FU--states feeling better, but asking if continues prednisone-- Is Patient Diabetic? No   Primary Care Provider:  Birdie Sons MD  CC:  2 week FU--states feeling better and but asking if continues prednisone--.  History of Present Illness: presumptive diagnosis of PMR she is feeling much better on prednisone (at least 80 % better).  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 Inhale 1 Puff As Directed Twice A Day 2)  Nasonex 50 Mcg/act  Susp (Mometasone Furoate) .... Two Puff Once Daily As Needed 3)  Centrum Silver   Tabs (Multiple Vitamins-Minerals) .... Once Daily 4)  Proair Hfa 108 (90 Base) Mcg/act Aers (Albuterol Sulfate) .... As Needed 5)  Benicar 40 Mg Tabs (Olmesartan Medoxomil) .... Once Daily 6)  Advil 200 Mg Tabs (Ibuprofen) .... As Needed 7)  Labetalol Hcl 200 Mg Tabs (Labetalol Hcl) .Marland Kitchen.. 1 Tab Twice Daily 8)  Furosemide 20  Mg Tabs (Furosemide) .... Take One Tablet By Mouth As Needed. 9)  Allopurinol 300 Mg Tabs (Allopurinol) .... Take 1 Tablet By Mouth Once A Day 10)  Prednisone 10 Mg  Tabs (Prednisone) .... 2 By Mouth Once Daily or As Directed  Allergies: 1)  ! Asa 2)  ! * Colcrys  Past History:  Past Medical History: Last updated: 12/20/2009 Allergic rhinitis COPD stage II Golds  Hyperlipidemia Hypertension Osteoporosis Gout   Past Surgical History: Last updated: 12-23-06 D and C fractured arm--surgical repair thyroglossal cyst  Family History: Last updated: 12-23-2006 Mother deceased heart disease-dm Family History Diabetes 1st degree relative-mother father-65 lung CA Family History Lung cancer  Social History: Last updated: Dec 23, 2006 Former Smoker Alcohol use-no Regular exercise-no  Risk Factors: Exercise: no (12-23-06)  Risk Factors: Smoking Status: quit > 6 months (02/21/2010)  Physical Exam  General:  alert and well-developed.   Head:  normocephalic and atraumatic.   Eyes:  pupils equal and pupils round.   Ears:  R ear normal and L ear normal.   Neck:  No deformities, masses, or tenderness noted. Chest Wall:  No deformities, masses, or tenderness noted. Lungs:  Normal respiratory effort, chest expands symmetrically. Lungs are clear to auscultation, no crackles or wheezes. Heart:  normal rate and regular rhythm.   Abdomen:  soft and non-tender.   Msk:  heberden's and bouchard's nodes  Impression & Recommendations:  Problem # 1:  UNSPECIFIED MYALGIA AND MYOSITIS (ICD-729.1) presumptive dx of PMR taper prednisone --- see RX side effects discussed Her updated medication list for this problem includes:    Advil 200 Mg Tabs (Ibuprofen) .Marland Kitchen... As needed  Complete Medication List: 1)  Advair Diskus 250-50 Mcg/dose Misc (Fluticasone-salmeterol) .... Inhale 1 puff as directed twice a day 2)  Nasonex 50 Mcg/act Susp (Mometasone furoate) .... Two puff once daily as  needed 3)  Centrum Silver Tabs (Multiple vitamins-minerals) .... Once daily 4)  Proair Hfa 108 (90 Base) Mcg/act Aers (Albuterol sulfate) .... As needed 5)  Benicar 40 Mg Tabs (Olmesartan medoxomil) .... Once daily 6)  Advil 200 Mg Tabs (Ibuprofen) .... As needed 7)  Labetalol Hcl 200 Mg Tabs (Labetalol hcl) .Marland Kitchen.. 1 tab twice daily 8)  Furosemide 20 Mg Tabs (Furosemide) .... Take one tablet by mouth as needed. 9)  Allopurinol 300 Mg Tabs (Allopurinol) .... Take 1 tablet by mouth once a day 10)  Prednisone 10 Mg Tabs (Prednisone) .Marland Kitchen.. 1 1/2  by mouth once daily or as directed  Patient Instructions: 1)  Please schedule a follow-up appointment in 2 weeks.

## 2010-09-03 NOTE — Assessment & Plan Note (Signed)
Summary: 1 month rov/njr   Vital Signs:  Patient profile:   77 year old female Weight:      152 pounds Temp:     98.3 degrees F oral Pulse rate:   88 / minute BP sitting:   120 / 80  (left arm) Cuff size:   regular  Vitals Entered By: Romualdo Bolk, CMA Duncan Dull) (April 25, 2010 11:39 AM) CC: follow-up visit   Primary Care Provider:  Birdie Sons MD  CC:  follow-up visit.  History of Present Illness: PMR---great control prednsione--- see meds no joint pain no joint swelling  HX of gout no recurrence  htn---tolerating meds  All other systems reviewed and were negative   Preventive Screening-Counseling & Management  Alcohol-Tobacco     Smoking Status: quit > 6 months     Year Quit: 1998  Caffeine-Diet-Exercise     Does Patient Exercise: no  Current Medications (verified): 1)  Advair Diskus 250-50 Mcg/dose Misc (Fluticasone-Salmeterol) .... Inhale 1 Puff As Directed Twice A Day 2)  Nasonex 50 Mcg/act  Susp (Mometasone Furoate) .... Two Puff Once Daily As Needed 3)  Centrum Silver   Tabs (Multiple Vitamins-Minerals) .... Once Daily 4)  Proair Hfa 108 (90 Base) Mcg/act Aers (Albuterol Sulfate) .... As Needed 5)  Benicar 40 Mg Tabs (Olmesartan Medoxomil) .... Once Daily 6)  Labetalol Hcl 200 Mg Tabs (Labetalol Hcl) .Marland Kitchen.. 1 Tab Twice Daily 7)  Furosemide 20 Mg Tabs (Furosemide) .... Take One Tablet By Mouth As Needed. 8)  Allopurinol 300 Mg Tabs (Allopurinol) .... Take 1 Tablet By Mouth Once A Day 9)  Prednisone 10 Mg  Tabs (Prednisone) .... Take 1 Tablet By Mouth Every Other Day Alternating With 1/2 Tablet By Mouth Every Other Day  Allergies: 1)  ! Asa 2)  ! * Colcrys  Physical Exam  General:  alert and well-developed.   Head:  normocephalic and atraumatic.   Eyes:  pupils equal and pupils round.   Ears:  R ear normal and L ear normal.   Neck:  No deformities, masses, or tenderness noted. Chest Wall:  No deformities, masses, or tenderness noted. Lungs:   normal respiratory effort and no intercostal retractions.   Heart:  normal rate and regular rhythm.   Abdomen:  soft and non-tender.   Skin:  turgor normal and color normal.   Psych:  memory intact for recent and remote and not anxious appearing.     Impression & Recommendations:  Problem # 1:  POLYMYALGIA RHEUMATICA (ICD-725) decrease prednisone  Problem # 2:  HYPERTENSION (ICD-401.9)  controlled continue current medications  Her updated medication list for this problem includes:    Benicar 40 Mg Tabs (Olmesartan medoxomil) ..... Once daily    Labetalol Hcl 200 Mg Tabs (Labetalol hcl) .Marland Kitchen... 1 tab twice daily    Furosemide 20 Mg Tabs (Furosemide) .Marland Kitchen... Take one tablet by mouth as needed.  BP today: 120/80 Prior BP: 160/70 (03/28/2010)  Labs Reviewed: K+: 4.3 (12/06/2009) Creat: : 0.9 (12/06/2009)   Chol: 207 (09/14/2007)   HDL: 54.0 (09/14/2007)   LDL: DEL (09/14/2007)   TG: 111 (09/14/2007)  Problem # 3:  GOUT (ICD-274.9) no recurrence continue current medications  Her updated medication list for this problem includes:    Allopurinol 300 Mg Tabs (Allopurinol) .Marland Kitchen... Take 1 tablet by mouth once a day  Complete Medication List: 1)  Advair Diskus 250-50 Mcg/dose Misc (Fluticasone-salmeterol) .... Inhale 1 puff as directed twice a day 2)  Nasonex 50 Mcg/act Susp (Mometasone  furoate) .... Two puff once daily as needed 3)  Centrum Silver Tabs (Multiple vitamins-minerals) .... Once daily 4)  Proair Hfa 108 (90 Base) Mcg/act Aers (Albuterol sulfate) .... As needed 5)  Benicar 40 Mg Tabs (Olmesartan medoxomil) .... Once daily 6)  Labetalol Hcl 200 Mg Tabs (Labetalol hcl) .Marland Kitchen.. 1 tab twice daily 7)  Furosemide 20 Mg Tabs (Furosemide) .... Take one tablet by mouth as needed. 8)  Allopurinol 300 Mg Tabs (Allopurinol) .... Take 1 tablet by mouth once a day 9)  Prednisone 10 Mg Tabs (Prednisone) .... 1/2 by mouth once daily  Other Orders: Flu Vaccine 31yrs + MEDICARE PATIENTS  (Z6109) Administration Flu vaccine - MCR (U0454)  Patient Instructions: 1)  Please schedule a follow-up appointment in 1 month.    Flu Vaccine Consent Questions     Do you have a history of severe allergic reactions to this vaccine? no    Any prior history of allergic reactions to egg and/or gelatin? no    Do you have a sensitivity to the preservative Thimersol? no    Do you have a past history of Guillan-Barre Syndrome? no    Do you currently have an acute febrile illness? no    Have you ever had a severe reaction to latex? no    Vaccine information given and explained to patient? yes    Are you currently pregnant? no    Lot Number:[id number]   Exp Date:01/31/2011   Site Given  Left Deltoid IMlu Romualdo Bolk, CMA (AAMA)  April 25, 2010 11:40 AM

## 2010-09-03 NOTE — Consult Note (Signed)
Summary: [facility]  [facility]   Imported By: Maryln Gottron 01/15/2010 15:43:53  _____________________________________________________________________  External Attachment:    Type:   Image     Comment:   External Document

## 2010-09-03 NOTE — Progress Notes (Signed)
Summary: pt wants to give and talk about readings   Phone Note Call from Patient Call back at Home Phone [phone]   Caller: Patient Reason for Call: Talk to Nurse Summary of Call: pt called to give you readings but she didn't want to leave them with me because  she wants to talk about them Initial call taken by: Omer Jack,  August 15, 2009 12:25 PM  Follow-up for Phone Call        BP Readings: 08/06/09     143/61 89 08/07/09     122/61 93 08/08/09     146/62 91 08/09/09     158/62 97    later  145/58 107 08/10/09     156/62 103  later  138/60 at MD office 08/11/09     139/68 101  later  146/69 96 08/12/09   149/67 99 1//11/11  146/68 94 08/14/09   160/70 95 08/15/09   159/74 92   later 166/69 100  later 157/73 97  Will pass on to Dr. Graciela Husbands tomorrow. Pt feels like the Benicar was working better. Follow-up by: Duncan Dull, RN, BSN,  August 15, 2009 2:38 PM  Additional Follow-up for Phone Call Additional follow up Details #1::        Per Dr. Graciela Husbands, if her Palp are OK we can add Benicar back at same dose. If Palps are worse we can double her Diltiazem. I will call her Friday when I return with the update.  Duncan Dull, RN, BSN  August 21, 2009 7:06 PM     Additional Follow-up for Phone Call Additional follow up Details #2::    Called pt with Dr. Koren Bound suggestion to restart the Benicar 40 QD. She understands and agrees with plan.  Follow-up by: Duncan Dull, RN, BSN,  August 23, 2009 9:27 AM

## 2010-09-03 NOTE — Assessment & Plan Note (Signed)
Summary: BODY ACHES, MEDICATION CONCERNS // RS   Vital Signs:  Patient profile:   77 year old female Weight:      148 pounds Temp:     98.1 degrees F oral Pulse rate:   82 / minute Pulse rhythm:   regular Resp:     12 per minute BP sitting:   120 / 58  (left arm) Cuff size:   regular  Vitals Entered By: Gladis Riffle, RN (February 07, 2010 11:58 AM) CC: c/o pain back of bilateral legs and shoulders Is Patient Diabetic? No Comments state by Dr Henrietta Dine colcrys was dcd due to myalgia and told to stay on allopurinol--states awaiting appt from rheumatologist   Primary Care Provider:  Birdie Sons MD  CC:  c/o pain back of bilateral legs and shoulders.  History of Present Illness: see last ov note pt not informed to take prednisone---see append she continues to have aches and pains---see previous note---reviewed with patient otherwise no complaints  she has had no recurrent gout---open tophi on toe---still non healing---NO pain  All other systems reviewed and were negative   All other systems reviewed and were negative   Preventive Screening-Counseling & Management  Alcohol-Tobacco     Smoking Status: quit > 6 months     Year Quit: 1998  Current Medications (verified): 1)  Advair Diskus 250-50 Mcg/dose Misc (Fluticasone-Salmeterol) .... Inhale 1 Puff As Directed Twice A Day 2)  Nasonex 50 Mcg/act  Susp (Mometasone Furoate) .... Two Puff Once Daily As Needed 3)  Centrum Silver   Tabs (Multiple Vitamins-Minerals) .... Once Daily 4)  Proair Hfa 108 (90 Base) Mcg/act Aers (Albuterol Sulfate) .... As Needed 5)  Benicar 40 Mg Tabs (Olmesartan Medoxomil) .... Once Daily 6)  Advil 200 Mg Tabs (Ibuprofen) .... As Needed 7)  Labetalol Hcl 200 Mg Tabs (Labetalol Hcl) .Marland Kitchen.. 1 Tab Twice Daily 8)  Furosemide 20 Mg Tabs (Furosemide) .... Take One Tablet By Mouth As Needed. 9)  Allopurinol 300 Mg Tabs (Allopurinol) .... Take 1 Tablet By Mouth Once A Day  Allergies: 1)  ! Asa 2)  ! *  Colcrys  Past History:  Past Medical History: Last updated: 12/20/2009 Allergic rhinitis COPD stage II Golds  Hyperlipidemia Hypertension Osteoporosis Gout   Past Surgical History: Last updated: 12-Jan-2007 D and C fractured arm--surgical repair thyroglossal cyst  Family History: Last updated: 2007-01-12 Mother deceased heart disease-dm Family History Diabetes 1st degree relative-mother father-65 lung CA Family History Lung cancer  Social History: Last updated: Jan 12, 2007 Former Smoker Alcohol use-no Regular exercise-no  Risk Factors: Exercise: no (01-12-07)  Risk Factors: Smoking Status: quit > 6 months (02/07/2010)  Physical Exam  General:  alert and well-developed.   Head:  normocephalic and atraumatic.   Eyes:  pupils equal and pupils round.   Ears:  R ear normal and L ear normal.   Neck:  No deformities, masses, or tenderness noted. Chest Wall:  No deformities, masses, or tenderness noted. Lungs:  Normal respiratory effort, chest expands symmetrically. Lungs are clear to auscultation, no crackles or wheezes. Abdomen:  soft and non-tender.   Msk:  no joint swelling  Neurologic:  cranial nerves II-XII intact and gait normal.   Skin:  turgor normal and color normal.     Impression & Recommendations:  Problem # 1:  UNSPECIFIED MYALGIA AND MYOSITIS (ICD-729.1) discussed possibilities---possibly PMR---discussed reasonable to try prednisone  see me 10 days Her updated medication list for this problem includes:    Advil 200 Mg Tabs (  Ibuprofen) .Marland Kitchen... As needed side efffects discussed  Complete Medication List: 1)  Advair Diskus 250-50 Mcg/dose Misc (Fluticasone-salmeterol) .... Inhale 1 puff as directed twice a day 2)  Nasonex 50 Mcg/act Susp (Mometasone furoate) .... Two puff once daily as needed 3)  Centrum Silver Tabs (Multiple vitamins-minerals) .... Once daily 4)  Proair Hfa 108 (90 Base) Mcg/act Aers (Albuterol sulfate) .... As needed 5)   Benicar 40 Mg Tabs (Olmesartan medoxomil) .... Once daily 6)  Advil 200 Mg Tabs (Ibuprofen) .... As needed 7)  Labetalol Hcl 200 Mg Tabs (Labetalol hcl) .Marland Kitchen.. 1 tab twice daily 8)  Furosemide 20 Mg Tabs (Furosemide) .... Take one tablet by mouth as needed. 9)  Allopurinol 300 Mg Tabs (Allopurinol) .... Take 1 tablet by mouth once a day 10)  Prednisone 10 Mg Tabs (Prednisone) .... 2 by mouth once daily or as directed  Patient Instructions: 1)  see me 10-14 days Prescriptions: PREDNISONE 10 MG  TABS (PREDNISONE) 2 by mouth once daily or as directed  #60 x 1   Entered and Authorized by:   Birdie Sons MD   Signed by:   Birdie Sons MD on 02/07/2010   Method used:   Electronically to        [facility] [address] (retail)       [address]       [place], [place]  [address]       Ph: [phone] or [phone]       Fax: [phone]   RxID:   [id number]

## 2010-09-03 NOTE — Assessment & Plan Note (Signed)
Summary: 3wks rov/mm rsc bmp/njr   Vital Signs:  Patient profile:   77 year old female Weight:      152 pounds Temp:     98.0 degrees F oral Pulse rate:   96 / minute Pulse rhythm:   regular BP sitting:   150 / 70  (left arm) Cuff size:   regular  Vitals Entered By: Alfred Levins, CMA (June 17, 2010 10:24 AM) CC: f/u on meds   Primary Care Keysi Oelkers:  Birdie Sons MD  CC:  f/u on meds.  History of Present Illness: PMR--currently on prednisone 10 mg by mouth once daily.  she is feeling much better no joint swelling no rashes.  no complications with prednisone.  No other complaints in a complete review of systems. She history of gout previously, no recurrence.  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 Inhale 1 Puff As Directed Twice A Day 2)  Centrum Silver   Tabs (Multiple Vitamins-Minerals) .... Once Daily 3)  Proair Hfa 108 (90 Base) Mcg/act Aers (Albuterol Sulfate) .... As Needed 4)  Benicar 40 Mg Tabs (Olmesartan Medoxomil) .... Once Daily 5)  Labetalol Hcl 200 Mg Tabs (Labetalol Hcl) .Marland Kitchen.. 1 Tab Twice Daily 6)  Furosemide 20 Mg Tabs (Furosemide) .... Take One Tablet By Mouth As Needed. 7)  Allopurinol 300 Mg Tabs (Allopurinol) .... Take 1 Tablet By Mouth Once A Day 8)  Prednisone 10 Mg  Tabs (Prednisone) .... One By Mouth Daily.  Allergies (verified): 1)  ! Asa 2)  ! * Colcrys  Past History:  Past Medical History: Last updated: 12/20/2009 Allergic rhinitis COPD stage II Golds  Hyperlipidemia Hypertension Osteoporosis Gout   Past Surgical History: Last updated: Jan 11, 2007 D and  C fractured arm--surgical repair thyroglossal cyst  Family History: Last updated: 2007-01-11 Mother deceased heart disease-dm Family History Diabetes 1st degree relative-mother father-65 lung CA Family History Lung cancer  Social History: Last updated: 01/11/07 Former Smoker Alcohol use-no Regular exercise-no  Risk Factors: Exercise: no (04/25/2010)  Risk Factors: Smoking Status: quit > 6 months (04/25/2010)  Physical Exam  General:  very pleasant female in no acute distress. HEENT exam atraumatic, normocephalic. Neck is supple. Extremities without any clubbing cyanosis or edema. Evaluation of joints: She is without effusions or tenosynovitis. Dermatologic exam no rashes.   Impression & Recommendations:  Problem # 1:  POLYMYALGIA RHEUMATICA (ICD-725) On prednisone---will need to taper slowly see me 4 weeks  Complete Medication List: 1)  Advair Diskus 250-50 Mcg/dose Misc (Fluticasone-salmeterol) .... Inhale 1 puff as directed twice a day 2)  Centrum Silver Tabs (Multiple vitamins-minerals) .... Once daily 3)  Proair Hfa 108 (90 Base) Mcg/act Aers (Albuterol sulfate) .... As needed 4)  Benicar 40 Mg Tabs (Olmesartan medoxomil) .... Once daily 5)  Labetalol Hcl 200 Mg Tabs (Labetalol hcl) .Marland Kitchen.. 1 tab twice daily 6)  Furosemide 20 Mg Tabs (Furosemide) .... Take one tablet by mouth as needed. 7)  Allopurinol 300 Mg Tabs (Allopurinol) .... Take 1 tablet by mouth once a day 8)  Prednisone 10 Mg Tabs (Prednisone) .... Take one tablet by mouth every other day alternating with 1/2 by mouth every other day  Patient Instructions: 1)  Please schedule a follow-up appointment in 1 month. n  Orders Added: 1)  Est. Patient Level III [696[address]]

## 2010-09-03 NOTE — Assessment & Plan Note (Signed)
Summary: ec6/SVT on event moniter/dm  Medications Added DILTIAZEM HCL ER BEADS 180 MG XR24H-CAP (DILTIAZEM HCL ER BEADS) once daily        History of Present Illness:    [name] is seen at the request of Dr. Clent Ridges because of recurrent tachypalpitations.  She is an 77 year old woman with a long-standing history of such dating back 20-25 years ago. Most of these episodes are relatively brief lasting 30 seconds or so. One episode many years ago it lasted hours. They are at their beginning associated with lightheadedness, but no syncope. There has been some shortness of breath but no chest discomfort. They are from positive.  Because of these symptoms she was given an event recorder. This was read by one of our colleagues who felt that it represented "ventricular tachycardia" per patient report. Unfortunately I have not yet been able to tract down the primary data.  She also underwent echocardiography this fall demonstrating      - Left ventricle: The cavity size was normal. There was mild focal       basal hypertrophy of the septum. Systolic function was vigorous.       The estimated ejection fraction was in the range of 65% to 70%.       Wall motion was normal; there were no regional wall motion       abnormalities. Doppler parameters are consistent with abnormal       left ventricular relaxation (grade 1 diastolic dysfunction).     - Left atrium: The atrium was mildly dilated.  in the past she has undergone Myoview scanning. This is more than 10-12 years ago. She has a history of dyspnea on exertion unassociated with chest discomfort. She has some peripheral edema, stable 2 pillow orthopnea and no nocturnal dyspnea. She has a history of hypertension and dyslipidemia; her family history is negative for coronary artery disease  Problems Prior to Update: 1)  Unspecified Cellulitis and Abscess of Toe  (ICD-681.10) 2)  Palpitations  (ICD-785.1) 3)  Osteoarthritis, Knee  (ICD-715.96) 4)   Unspecified Myalgia and Myositis  (ICD-729.1) 5)  Paresthesia  (ICD-782.0) 6)  Back Pain  (ICD-724.5) 7)  Family History Diabetes 1st Degree Relative  (ICD-V18.0) 8)  Osteoporosis  (ICD-733.00) 9)  Hypertension  (ICD-401.9) 10)  Hyperlipidemia  (ICD-272.4) 11)  COPD  (ICD-496) 12)  Allergic Rhinitis  (ICD-477.9)  Current Medications (verified): 1)  Advair Diskus 250-50 Mcg/dose Misc (Fluticasone-Salmeterol) .... Inhale 1 Puff As Directed Twice A Day 2)  Dyazide 37.5-25 Mg Caps (Triamterene-Hctz) .... Take 1 Capsule By Mouth Every Morning 3)  Nasonex 50 Mcg/act  Susp (Mometasone Furoate) .... Two Puff Once Daily As Needed 4)  Centrum Silver   Tabs (Multiple Vitamins-Minerals) .... Once Daily 5)  Benicar 40 Mg  Tabs (Olmesartan Medoxomil) .... One Tab Daily 6)  Proair Hfa 108 (90 Base) Mcg/act Aers (Albuterol Sulfate) .... As Needed 7)  Cephalexin 500 Mg Caps (Cephalexin) .... One By Mouth Three Times A Day For 10 Days  Allergies: 1)  ! Asa  Vital Signs:  Patient profile:   77 year old female Height:      59 inches Weight:      151 pounds Pulse rate:   96 / minute Pulse rhythm:   irregular BP sitting:   118 / 60  (right arm) Cuff size:   regular  Vitals Entered By: Danielle Rankin, CMA (August 05, 2009 1:42 PM)  Physical Exam  General:  Alert and oriented old Caucasian  female appearing younger than her stated age of 51 in no acute distress. HEENT  normal . Neck veins were 7-8 cm carotids brisk and full without bruits. No lymphadenopathy. Back with  kyphosis. Lungs clear. Heart sounds regular without murmurs or gallops. PMI nondisplaced. Abdomen soft with active bowel sounds without midline pulsation or hepatomegaly. Femoral pulses and distal pulses intact. Extremities were without clubbing cyanosis or edemaSkin warm and dry. Neurological exam grossly normal;  affect was grossly    EKG  Procedure date:  08/05/2009  Findings:      sinus rhythm at 96 Intervals 0.14/0.07/23  5 Axis -10 Low voltage  Impression & Recommendations:  Problem # 1:  PALPITATIONS (ICD-785.1) her palpitations has been demonstrated to be associated with supraventricular tachycardia probably AV nodal reentry. As she has a history of hypertension, we'll plan to discontinue her Benicar and try her on a calcium channel blocker to see if we can cover both issues at one time. I should note that she does not have constipation.  We also discussed the potential role of catheter ablation. We'll hold that in abeyance to see how it is that she responds to medical therapy.  Problem # 2:  DYSPNEA ON EXERTION (ICD-786.09) I am somewhat bothered by this especially as she has some associated chest discomfort. She has a rapid resting rate. It may well be that in the setting of long-standing hypertension she has modest diastolic dysfunction identified by echo and that her rapid rates contributing to her dyspnea. Hopefully slowing her heart rate down with a calcium blocker will help ameliorate some of the symptoms. This may also represent however an anginal equivalent, and if her symptoms do not abate, I would think about pursuing Myoview scanning notwithstanding her age given her a vibrant personality The following medications were removed from the medication list:    Benicar 40 Mg Tabs (Olmesartan medoxomil) ..... One tab daily Her updated medication list for this problem includes:    Dyazide 37.5-25 Mg Caps (Triamterene-hctz) .Marland Kitchen... Take 1 capsule by mouth every morning    Diltiazem Hcl Er Beads 180 Mg Xr24h-cap (Diltiazem hcl er beads) ..... 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 One tab daily Her updated medication list for this problem includes:    Dyazide 37.5-25 Mg Caps (Triamterene-hctz) .Marland Kitchen... Take  1 capsule by mouth every morning    Diltiazem Hcl Er Beads 180 Mg Xr24h-cap (Diltiazem hcl er beads) ..... Once daily  Patient Instructions: 1)  Your physician has recommended you make the following change in your medication: STOP BENICAR. START DILTIAZEM 180MG  DAILY.  2)  CALL IN 2 WEEKS WITH BP READINGS 3)  Your physician recommends that you schedule a follow-up appointment in: 3-4 MONTHS Prescriptions: DILTIAZEM HCL ER BEADS 180 MG XR24H-CAP (DILTIAZEM HCL ER BEADS) once daily  #30 x 11   Entered by:   Duncan Dull, RN, BSN   Authorized by:   Nathen May, MD, [facility]   Signed by:   Duncan Dull, RN, BSN on 08/05/2009   Method used:   Electronically to        [facility] [address] (retail)       [address]       [facility] [place], [place]  [id number]       Ph: [phone] or [phone]       Fax: [phone]   RxID:   [id number]   Appended Document: [facility]      Allergies: 1)  ! Asa  Review of Systems       full review of systems was negative apart from a history of present illness and past medical history. \par

## 2010-09-03 NOTE — Progress Notes (Signed)
Summary: congestion and wheezing  Phone Note Call from Patient   Caller: Patient Call For: [place] Summary of Call: pt coughing and congested . have a prescript that was given to her last year by dr Delford Field for avelox is it okay to use it. Initial call taken by: Rickard Patience,  April 28, 2010 4:14 PM  Follow-up for Phone Call        Spoke with pt.  She is c/o chest congestion and "tickle in chest" x 2 days.  She states that she she had some wheezing last night and but denies any coughing.  She states that she has rx that PW gave her to hold on to for 5 days of avelox and wants to know if okay to go ahead and take.  She has upcoming appt with PW for 05/21/10.  Pls advise thanks Follow-up by: Vernie Murders,  April 28, 2010 4:20 PM  Additional Follow-up for Phone Call Additional follow up Details #1::        ok to take for 5 days and will need OV next week for recheck Additional Follow-up by: Storm Frisk MD,  April 28, 2010 4:21 PM    Additional Follow-up for Phone Call Additional follow up Details #2::    Spoke with pt and notified okay to take the 5 days of avelox.  Appt was sched with PW for 05/06/10 at 11:30 am. Follow-up by: Vernie Murders,  April 28, 2010 4:29 PM

## 2010-09-03 NOTE — Assessment & Plan Note (Signed)
Summary: 2 WK F/U // RS   Vital Signs:  Patient profile:   77 year old female Weight:      149 pounds Temp:     97.8 degrees F oral Pulse rate:   88 / minute Pulse rhythm:   regular Resp:     12 per minute BP sitting:   126 / 68  (left arm) Cuff size:   regular  Vitals Entered By: Gladis Riffle, RN (March 05, 2010 9:20 AM) CC: 2 week rov--c/o frequency with small amounts of urine x 2 days Is Patient Diabetic? No   Primary Care Provider:  Birdie Sons MD  CC:  2 week rov--c/o frequency with small amounts of urine x 2 days.  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 Inhale 1 Puff As Directed Twice A Day 2)  Nasonex 50 Mcg/act  Susp (Mometasone Furoate) .... Two Puff Once Daily As Needed 3)  Centrum Silver   Tabs (Multiple Vitamins-Minerals) .... Once Daily 4)  Proair Hfa 108 (90 Base) Mcg/act Aers (Albuterol Sulfate) .... As Needed 5)  Benicar 40 Mg Tabs (Olmesartan Medoxomil) .... Once Daily 6)  Labetalol Hcl 200 Mg Tabs (Labetalol Hcl) .Marland Kitchen.. 1 Tab Twice Daily 7)  Furosemide 20 Mg Tabs (Furosemide) .... Take One Tablet By Mouth As Needed. 8)  Allopurinol 300 Mg Tabs (Allopurinol) .... Take 1  Tablet By Mouth Once A Day 9)  Prednisone 10 Mg  Tabs (Prednisone) .Marland Kitchen.. 1 1/2  By Mouth Once Daily or As Directed  Allergies: 1)  ! Asa 2)  ! * Colcrys  Past History:  Past Medical History: Last updated: 12/20/2009 Allergic rhinitis COPD stage II Golds  Hyperlipidemia Hypertension Osteoporosis Gout   Past Surgical History: Last updated: 01/03/2007 D and C fractured arm--surgical repair thyroglossal cyst  Family History: Last updated: 2007/01/03 Mother deceased heart disease-dm Family History Diabetes 1st degree relative-mother father-65 lung CA Family History Lung cancer  Social History: Last updated: Jan 03, 2007 Former Smoker Alcohol use-no Regular exercise-no  Risk Factors: Exercise: no (01/03/07)  Risk Factors: Smoking Status: quit > 6 months (03/05/2010)  Physical Exam  General:  alert and well-developed.   Head:  normocephalic and atraumatic.   Eyes:  pupils equal and pupils round.   Ears:  R ear normal.   Neck:  No deformities, masses, or tenderness noted. Lungs:  normal respiratory effort and no intercostal retractions.   Heart:  normal rate and regular rhythm.   Abdomen:  soft and non-tender.   Genitalia:  no suprapubic or CVA tenderness Msk:  heberden's and bouchard's nodes   Impression & Recommendations:  Problem # 1:  UTI (ICD-599.0) see ua call if sxs persist Her updated medication list for this problem  includes:    Cipro 250 Mg Tabs (Ciprofloxacin hcl) .Marland Kitchen... 1 by mouth 2 times daily  Problem # 2:  POLYMYALGIA RHEUMATICA (ICD-725) decrease prednisone side effects discussed  Problem # 3:  HYPERTENSION (ICD-401.9)  controlled continue current medications  Her updated medication list for this problem includes:    Benicar 40 Mg Tabs (Olmesartan medoxomil) ..... Once daily    Labetalol Hcl 200 Mg Tabs (Labetalol hcl) .Marland Kitchen... 1 tab twice daily    Furosemide 20 Mg Tabs (Furosemide) .Marland Kitchen... Take one tablet by mouth as needed.  BP today:  126/68 Prior BP: 110/68 (02/21/2010)  Labs Reviewed: K+: 4.3 (12/06/2009) Creat: : 0.9 (12/06/2009)   Chol: 207 (09/14/2007)   HDL: 54.0 (09/14/2007)   LDL: DEL (09/14/2007)   TG: 111 (09/14/2007)  Complete Medication List: 1)  Advair Diskus 250-50 Mcg/dose Misc (Fluticasone-salmeterol) .... Inhale 1 puff as directed twice a day 2)  Nasonex 50 Mcg/act Susp (Mometasone furoate) .... Two puff once daily as needed 3)  Centrum Silver Tabs (Multiple vitamins-minerals) .... Once daily 4)  Proair Hfa 108 (90 Base) Mcg/act Aers (Albuterol sulfate) .... As needed 5)  Benicar 40 Mg Tabs (Olmesartan medoxomil) .... Once daily 6)  Labetalol Hcl 200 Mg Tabs (Labetalol hcl) .Marland Kitchen.. 1 tab twice daily 7)  Furosemide 20 Mg Tabs (Furosemide) .... Take one tablet by mouth as needed. 8)  Allopurinol 300 Mg Tabs (Allopurinol) .... Take 1 tablet by mouth once a day 9)  Prednisone 10 Mg Tabs (Prednisone) .... Take 1 tablet by mouth once a day 10)  Cipro 250 Mg Tabs (Ciprofloxacin hcl) .Marland Kitchen.. 1 by mouth 2 times daily  Other Orders: UA Dipstick w/o Micro (automated)  (81003)  Patient Instructions: 1)  see me 3 weeks Prescriptions: CIPRO 250 MG TABS (CIPROFLOXACIN HCL) 1 by mouth 2 times daily  #10 x 0   Entered and Authorized by:   Birdie Sons MD   Signed by:   Birdie Sons MD on 03/05/2010   Method used:   Electronically to        [facility] [address] (retail)       [address]       [place], [place]  [address]       Ph: [phone] or [phone]       Fax: [phone]   RxID:   [id number]   Appended Document: 2 WK F/U // RS  Laboratory Results   Urine Tests    Routine Urinalysis   Color: yellow Appearance: Clear Glucose: negative   (Normal Range: Negative) Bilirubin: negative   (Normal Range: Negative) Ketone: negative   (Normal Range: Negative) Spec. Gravity: 1.010   (Normal Range: 1.003-1.035) Blood: 2+   (Normal Range: Negative) pH: 6.0    (Normal Range: 5.0-8.0) Protein: negative   (Normal Range: Negative) Urobilinogen: 0.2   (Normal Range: 0-1) Nitrite: negative   (Normal Range: Negative) Leukocyte Esterace: 2+   (Normal Range: Negative)    Comments: Rita Ohara  March 05, 2010 1:08 PM

## 2010-09-04 NOTE — Assessment & Plan Note (Signed)
Summary: 1 month fup//ccm   Vital Signs:  Patient profile:   77 year old female Weight:      152 pounds Temp:     98.4 degrees F oral Pulse rate:   82 / minute BP sitting:   152 / 74  (left arm) Cuff size:   regular  Vitals Entered By: Alfred Levins, CMA (July 16, 2010 9:42 AM) CC: f/u on prednisone   Primary Care Provider:  Birdie Sons MD  CC:  f/u on prednisone.  History of Present Illness: PMR---she continues to do very well prednisone 10 mg by mouth every other day alternating with 1/2 tab by mouth every other day   All other systems reviewed and were negative   Current Medications (verified): 1)  Advair Diskus 250-50 Mcg/dose Misc (Fluticasone-Salmeterol) .... Inhale 1 Puff As Directed Twice A Day 2)  Centrum Silver   Tabs (Multiple Vitamins-Minerals) .... Once Daily 3)  Proair Hfa 108 (90 Base) Mcg/act Aers (Albuterol Sulfate) .... As Needed 4)  Benicar 40 Mg Tabs (Olmesartan Medoxomil) .... Once Daily 5)  Labetalol Hcl 200 Mg Tabs (Labetalol Hcl) .Marland Kitchen.. 1 Tab Twice Daily 6)  Allopurinol 300 Mg Tabs (Allopurinol) .... Take 1 Tablet By Mouth Once A Day 7)  Prednisone 10 Mg  Tabs (Prednisone) .... Take One Tablet By Mouth Every Other Day Alternating With 1/2 By Mouth Every Other Day 8)  Vitamin D3 1000 Unit Tabs (Cholecalciferol) .... Take 1 Tablet By Mouth Once A Day  Allergies (verified): 1)  ! Asa 2)  ! * Colcrys  Past History:  Past Medical History: Last updated: 06/25/2010 Allergic rhinitis COPD stage II Golds  Hyperlipidemia Hypertension Osteoporosis Gout  PMR   -cyclic prednisone  Past Surgical History: Last updated: 2007-01-01 D and C fractured arm--surgical repair thyroglossal cyst  Family History: Last updated: 2007-01-01 Mother deceased heart disease-dm Family History Diabetes 1st degree relative-mother father-65 lung CA Family History Lung cancer  Social History: Last updated: 01/01/07 Former Smoker Alcohol use-no Regular  exercise-no  Risk Factors: Exercise: no (04/25/2010)  Risk Factors: Smoking Status: quit > 6 months (04/25/2010)  Physical Exam  General:  elderly female in no acute distress. HEENT exam atraumatic, normocephalic, neck supple. Muscle skeletal exam no obvious tenosynovitis. No rashes.   Impression & Recommendations:  Problem # 1:  POLYMYALGIA RHEUMATICA (ICD-725) much improved.   decrease prednisone to 5 mg by mouth once daily  see me 6 weks   Complete Medication List: 1)  Advair Diskus 250-50 Mcg/dose Misc (Fluticasone-salmeterol) .... Inhale 1 puff as directed twice a day 2)  Centrum Silver Tabs (Multiple vitamins-minerals) .... Once daily 3)  Proair Hfa 108 (90 Base) Mcg/act Aers (Albuterol sulfate) .... As needed 4)  Benicar 40 Mg Tabs (Olmesartan medoxomil) .... Once daily 5)  Labetalol Hcl 200 Mg Tabs (Labetalol hcl) .Marland Kitchen.. 1 tab twice daily 6)  Allopurinol 300 Mg Tabs (Allopurinol) .... Take 1 tablet by mouth once a day 7)  Prednisone 10 Mg Tabs (Prednisone) .... 1/2 by mouth once daily 8)  Vitamin D3 1000 Unit Tabs (Cholecalciferol) .... Take 1 tablet by mouth once a day  Patient Instructions: 1)  6 weeks

## 2010-09-04 NOTE — Assessment & Plan Note (Signed)
Summary: 6 WK FUP/CJR   Vital Signs:  Patient profile:   77 year old female Weight:      150 pounds Temp:     98.6 degrees F oral Pulse rate:   76 / minute Pulse rhythm:   regular BP sitting:   154 / 72  (left arm) Cuff size:   regular  Vitals Entered By: Alfred Levins, CMA (August 27, 2010 10:18 AM)  Serial Vital Signs/Assessments:  Time      Position  BP       Pulse  Resp  Temp     By                     138/80                         Birdie Sons MD  CC: f/u on prednisone   Primary Care Provider:  Birdie Sons MD  CC:  f/u on prednisone.  History of Present Illness: RECENT URI without exacerbation of bronchospasm---sxs are resolving---still with minimal cough  gout flare---left great toe---resolved with advil (on allopurinol)  PMR---she is feeling great 5 mg prednisone daily  HTN---tolerating meds without difficulty  All other systems reviewed and were negative   Current Problems (verified): 1)  Polymyalgia Rheumatica  (ICD-725) 2)  Svt Probably Av [place]  (ICD-427.0) 3)  Hypertension  (ICD-401.9) 4)  Hyperlipidemia  (ICD-272.4) 5)  Edema  (ICD-782.3) 6)  Gout  (ICD-274.9) 7)  Osteoarthritis, Knee  (ICD-715.96) 8)  Osteoporosis  (ICD-733.00) 9)  COPD  (ICD-496)  Current Medications (verified): 1)  Advair Diskus 250-50 Mcg/dose Misc (Fluticasone-Salmeterol) .... Inhale 1 Puff As Directed Twice A Day 2)  Centrum Silver   Tabs (Multiple Vitamins-Minerals) .... Once Daily 3)  Proair Hfa 108 (90 Base) Mcg/act Aers (Albuterol Sulfate) .... As Needed 4)  Benicar 40 Mg Tabs (Olmesartan Medoxomil) .... Once Daily 5)  Labetalol Hcl 200 Mg Tabs (Labetalol Hcl) .Marland Kitchen.. 1 Tab Twice Daily 6)  Allopurinol 300 Mg Tabs (Allopurinol) .... Take 1 Tablet By Mouth Once A Day 7)  Prednisone 10 Mg  Tabs (Prednisone) .... 1/2 By Mouth Once Daily 8)  Vitamin D3 1000 Unit Tabs (Cholecalciferol) .... Take 1 Tablet By Mouth Once A Day  Allergies (verified): 1)  ! Asa 2)  ! *  Colcrys  Past History:  Past Medical History: Last updated: 06/25/2010 Allergic rhinitis COPD stage II Golds  Hyperlipidemia Hypertension Osteoporosis Gout  PMR   -cyclic prednisone  Past Surgical History: Last updated: 01-18-2007 D and C fractured arm--surgical repair thyroglossal cyst  Family History: Last updated: 2007/01/18 Mother deceased heart disease-dm Family History Diabetes 1st degree relative-mother father-65 lung CA Family History Lung cancer  Social History: Last updated: 01-18-07 Former Smoker Alcohol use-no Regular exercise-no  Risk Factors: Exercise: no (04/25/2010)  Risk Factors: Smoking Status: quit > 6 months (04/25/2010)  Physical Exam  General:  elderly female in no acute distress. HEENT exam atraumatic, normocephalic, neck supple. Muscle skeletal exam no obvious tenosynovitis. No rashes. no swelling of either MTP joint (1st)   Impression & Recommendations:  Problem # 1:  POLYMYALGIA RHEUMATICA (ICD-725) much improved ---will taper prednisone  Problem # 2:  HYPERTENSION (ICD-401.9) repeat BP ok Her updated medication list for this problem includes:    Benicar 40 Mg Tabs (Olmesartan medoxomil) ..... Once daily    Labetalol Hcl 200 Mg Tabs (Labetalol hcl) .Marland Kitchen... 1 tab twice daily  BP today: 154/72 Prior  BP: 152/74 (07/16/2010)  Labs Reviewed: K+: 4.3 (12/06/2009) Creat: : 0.9 (12/06/2009)   Chol: 207 (09/14/2007)   HDL: 54.0 (09/14/2007)   LDL: DEL (09/14/2007)   TG: 111 (09/14/2007)  Problem # 3:  GOUT (ICD-274.9) will check labs Her updated medication list for this problem includes:    Allopurinol 300 Mg Tabs (Allopurinol) .Marland Kitchen... Take 1 tablet by mouth once a day  Problem # 4:  COPD (ICD-496) no sxs Her updated medication list for this problem includes:    Advair Diskus 250-50 Mcg/dose Misc (Fluticasone-salmeterol) ..... Inhale 1 puff as directed twice a day    Proair Hfa 108 (90 Base) Mcg/act Aers (Albuterol sulfate) .Marland Kitchen...  As needed  Complete Medication List: 1)  Advair Diskus 250-50 Mcg/dose Misc (Fluticasone-salmeterol) .... Inhale 1 puff as directed twice a day 2)  Centrum Silver Tabs (Multiple vitamins-minerals) .... Once daily 3)  Proair Hfa 108 (90 Base) Mcg/act Aers (Albuterol sulfate) .... As needed 4)  Benicar 40 Mg Tabs (Olmesartan medoxomil) .... Once daily 5)  Labetalol Hcl 200 Mg Tabs (Labetalol hcl) .Marland Kitchen.. 1 tab twice daily 6)  Allopurinol 300 Mg Tabs (Allopurinol) .... Take 1 tablet by mouth once a day 7)  Prednisone 2.5 Mg Tabs (Prednisone) .... 2 by mouth every other day alternating with 1 by mouth every other day 8)  Vitamin D3 1000 Unit Tabs (Cholecalciferol) .... Take 1 tablet by mouth once a day  Patient Instructions: 1)  you are being given prednisone 2.5 mg tablets. start taking these instead of the 10 mg tablets. use them as follows: take 2 tablets (total of 5 mg) every other day alternating with 1 tablet (total of 2.5 mg) every other day.  2)  Please schedule a follow-up appointment in 1 month. Prescriptions: PREDNISONE 2.5 MG TABS (PREDNISONE) 2 by mouth every other day alternating with 1 by mouth every other day  #100 x 1   Entered and Authorized by:   Birdie Sons MD   Signed by:   Birdie Sons MD on 08/27/2010   Method used:   Electronically to        [facility] [address] (retail)       [address]       [place], [place]  [address]       Ph: [phone] or [phone]       Fax: [phone]   RxID:   [id number]    Orders Added: 1)  Est. Patient Level IV [61607]

## 2010-09-04 NOTE — Progress Notes (Signed)
Summary: FYI  Phone Note Call from Patient   Caller: Patient Call For: Birdie Sons MD Summary of Call: Pt wanted Dr. Cato Mulligan to know that she had a gout flare up last week, and is better now. Initial call taken by: Lynann Beaver CMA AAMA,  August 06, 2010 12:45 PM

## 2010-09-23 ENCOUNTER — Encounter: Payer: Self-pay | Admitting: Internal Medicine

## 2010-09-24 ENCOUNTER — Encounter: Payer: Self-pay | Admitting: Internal Medicine

## 2010-09-24 ENCOUNTER — Ambulatory Visit (INDEPENDENT_AMBULATORY_CARE_PROVIDER_SITE_OTHER): Payer: Medicare Other | Admitting: Internal Medicine

## 2010-09-24 VITALS — BP 120/68 | HR 84 | Temp 98.3°F | Ht 59.0 in | Wt 155.0 lb

## 2010-09-24 DIAGNOSIS — M353 Polymyalgia rheumatica: Secondary | ICD-10-CM

## 2010-09-24 MED ORDER — PREDNISONE 2.5 MG PO TABS: ORAL_TABLET | ORAL | Status: DC

## 2010-09-24 NOTE — Progress Notes (Signed)
  Subjective:    Patient ID: [name], female    DOB: 04/28/1920, 77 y.o.   MRN: [id number]  HPI F/u pmr---she continues to feel well with only some aching of hands---tolerating meds without difficulty Hx of gout---no recurrence  Past Medical History  Diagnosis Date  . Allergy     rhinitis  . COPD (chronic obstructive pulmonary disease)     stage II Golds  . Hyperlipidemia   . Hypertension   . Osteoporosis   . Gout   . PMR (polymyalgia rheumatica)     cylic prednisone   Past Surgical History  Procedure Date  . Dilation and curettage of uterus   . Fractured arm     surgical repair  . Thyroglossal duct cyst     reports that she has quit smoking. She does not have any smokeless tobacco history on file. She reports that she does not drink alcohol. Her drug history not on file. family history includes Cancer in her father; Diabetes in her mother; and Heart disease in her mother.    Allergies  Allergen Reactions  . Aspirin     REACTION: reflux  . Colchicine     REACTION: myalgia      Review of Systems  patient denies chest pain, shortness of breath, orthopnea. Denies lower extremity edema, abdominal pain, change in appetite, change in bowel movements. Patient denies rashes, musculoskeletal complaints. No other specific complaints in a complete review of systems.      Objective:   Physical Exam  Well-developed well-nourished female in no acute distress. HEENT exam atraumatic, normocephalic, extraocular muscles are intact. Neck is supple. No jugular venous distention no thyromegaly. Chest clear to auscultation without increased work of breathing. Cardiac exam S1 and S2 are regular. Abdominal exam active bowel sounds, soft, nontender. Extremities no edema. Neurologic exam she is alert without any motor sensory deficits. Gait is normal.        Assessment & Plan:

## 2010-09-24 NOTE — Assessment & Plan Note (Signed)
Doing well decrease prednsione to 2.5 mg po qd  See me one month

## 2010-10-15 ENCOUNTER — Other Ambulatory Visit: Payer: Self-pay | Admitting: Internal Medicine

## 2010-10-22 ENCOUNTER — Encounter: Payer: Self-pay | Admitting: Internal Medicine

## 2010-10-22 ENCOUNTER — Ambulatory Visit (INDEPENDENT_AMBULATORY_CARE_PROVIDER_SITE_OTHER): Payer: Medicare Other | Admitting: Internal Medicine

## 2010-10-22 DIAGNOSIS — J449 Chronic obstructive pulmonary disease, unspecified: Secondary | ICD-10-CM

## 2010-10-22 DIAGNOSIS — M109 Gout, unspecified: Secondary | ICD-10-CM

## 2010-10-22 DIAGNOSIS — M353 Polymyalgia rheumatica: Secondary | ICD-10-CM

## 2010-10-22 DIAGNOSIS — I10 Essential (primary) hypertension: Secondary | ICD-10-CM

## 2010-10-22 NOTE — Assessment & Plan Note (Signed)
She has really has done quite well  Continue current meds Has regular f/u with dr Delford Field

## 2010-10-22 NOTE — Progress Notes (Signed)
  Subjective:    Patient ID: [name], female    DOB: [birth year], 77 y.o.   MRN: [id number]  HPI  She has PMR---responding to prednisone---maybe a little more pain than prior to last decrease of prednisone  Hx of gout---no recurrence  Maybe "a touch" of fatigue after working at [employer] (twice weekly)  Past Medical History  Diagnosis Date  . Allergy     rhinitis  . COPD (chronic obstructive pulmonary disease)     stage II Golds  . Hyperlipidemia   . Hypertension   . Osteoporosis   . Gout   . PMR (polymyalgia rheumatica)     cylic prednisone   Past Surgical History  Procedure Date  . Dilation and curettage of uterus   . Fractured arm     surgical repair  . Thyroglossal duct cyst     reports that she has quit smoking. She does not have any smokeless tobacco history on file. She reports that she does not drink alcohol. Her drug history not on file. family history includes Cancer in her father; Diabetes in her mother; and Heart disease in her mother. Allergies  Allergen Reactions  . Aspirin     REACTION: reflux  . Colchicine     REACTION: myalgia     Review of Systems  patient denies chest pain, shortness of breath, orthopnea. Denies lower extremity edema, abdominal pain, change in appetite, change in bowel movements. Patient denies rashes, musculoskeletal complaints. No other specific complaints in a complete review of systems.      Objective:   Physical Exam  Well-developed well-nourished female in no acute distress. HEENT exam atraumatic, normocephalic, extraocular muscles are intact. Neck is supple. No jugular venous distention no thyromegaly. Chest clear to auscultation without increased work of breathing. Cardiac exam S1 and S2 are regular. Abdominal exam active bowel sounds, soft, nontender. Extremities no edema. Neurologic exam she is alert without any motor sensory deficits. Gait is normal.        Assessment & Plan:

## 2010-10-22 NOTE — Assessment & Plan Note (Signed)
She is on low dose prednisone i'd prefer she continue this dose for 6 more weeks I'm concerned that decreasing dose would cause exacerbation

## 2010-10-22 NOTE — Assessment & Plan Note (Signed)
Adequate control Continue same meds 

## 2010-10-22 NOTE — Assessment & Plan Note (Signed)
No recurence and previous tophus on left foot/toe has completely resolved

## 2010-11-03 ENCOUNTER — Other Ambulatory Visit: Payer: Self-pay | Admitting: Internal Medicine

## 2010-11-24 ENCOUNTER — Ambulatory Visit (INDEPENDENT_AMBULATORY_CARE_PROVIDER_SITE_OTHER): Payer: Medicare Other | Admitting: Internal Medicine

## 2010-11-24 ENCOUNTER — Encounter: Payer: Self-pay | Admitting: Internal Medicine

## 2010-11-24 DIAGNOSIS — M7918 Myalgia, other site: Secondary | ICD-10-CM

## 2010-11-24 DIAGNOSIS — IMO0001 Reserved for inherently not codable concepts without codable children: Secondary | ICD-10-CM

## 2010-11-24 MED ORDER — PREDNISONE 10 MG PO TABS: ORAL_TABLET | ORAL | Status: AC

## 2010-11-30 DIAGNOSIS — M7918 Myalgia, other site: Secondary | ICD-10-CM | POA: Insufficient documentation

## 2010-11-30 NOTE — Progress Notes (Signed)
  Subjective:    Patient ID: [name], female    DOB: 1919/09/17, 77 y.o.   MRN: [id number]  HPI Patient presents to clinic for evaluation of shoulder pain. Notes one-week history of right upper shoulder blade pain without injury or trauma. Has full range of motion. No obvious exacerbating or alleviating factors. Denies neck pain or radicular arm pain or paresthesias. Does have history of PMR taking low dose prednisone 2.5 mg daily. No other complaints  Reviewed past medical history, medications and allergies    Review of Systems see history of present illness     Objective:   Physical Exam  Nursing note and vitals reviewed. Constitutional: She appears well-developed and well-nourished. No distress.  HENT:  Head: Normocephalic and atraumatic.  Eyes: Conjunctivae are normal. No scleral icterus.  Musculoskeletal:       Right shoulder demonstrates full range of motion. No crepitus. Nontender. No erythema warmth or effusion. Mild tenderness to palpation along the right posterior scapula. No bony abnormality. No other scapular abnormality noted.  Neurological: She is alert.  Skin: Skin is warm and dry. She is not diaphoretic.          Assessment & Plan:

## 2010-11-30 NOTE — Assessment & Plan Note (Signed)
Increased prednisone temporarily followed by reduction to chronic dosing. Followup if no improvement or worsening.Consider physical therapy if no improvement

## 2010-12-03 ENCOUNTER — Encounter: Payer: Self-pay | Admitting: Internal Medicine

## 2010-12-03 ENCOUNTER — Ambulatory Visit (INDEPENDENT_AMBULATORY_CARE_PROVIDER_SITE_OTHER): Payer: Medicare Other | Admitting: Internal Medicine

## 2010-12-03 DIAGNOSIS — M7918 Myalgia, other site: Secondary | ICD-10-CM

## 2010-12-03 DIAGNOSIS — M109 Gout, unspecified: Secondary | ICD-10-CM

## 2010-12-03 DIAGNOSIS — J449 Chronic obstructive pulmonary disease, unspecified: Secondary | ICD-10-CM

## 2010-12-03 DIAGNOSIS — I10 Essential (primary) hypertension: Secondary | ICD-10-CM

## 2010-12-03 DIAGNOSIS — IMO0001 Reserved for inherently not codable concepts without codable children: Secondary | ICD-10-CM

## 2010-12-03 DIAGNOSIS — J4489 Other specified chronic obstructive pulmonary disease: Secondary | ICD-10-CM

## 2010-12-03 DIAGNOSIS — M353 Polymyalgia rheumatica: Secondary | ICD-10-CM

## 2010-12-03 LAB — CBC WITH DIFFERENTIAL/PLATELET
Basophils Relative: 0.3 % (ref 0.0–3.0)
Eosinophils Relative: 1.5 % (ref 0.0–5.0)
HCT: 37.3 % (ref 36.0–46.0)
Hemoglobin: 12.8 g/dL (ref 12.0–15.0)
Lymphocytes Relative: 22.1 % (ref 12.0–46.0)
Lymphs Abs: 1.4 10*3/uL (ref 0.7–4.0)
Monocytes Relative: 9.4 % (ref 3.0–12.0)
Neutro Abs: 4.3 10*3/uL (ref 1.4–7.7)
RBC: 3.97 Mil/uL (ref 3.87–5.11)
RDW: 14.2 % (ref 11.5–14.6)
WBC: 6.4 10*3/uL (ref 4.5–10.5)

## 2010-12-03 LAB — BASIC METABOLIC PANEL
Calcium: 9.2 mg/dL (ref 8.4–10.5)
GFR: 68.88 mL/min (ref >60.00)
Glucose, Bld: 86 mg/dL (ref 70–99)
Potassium: 4.5 mEq/L (ref 3.5–5.1)
Sodium: 140 mEq/L (ref 135–145)

## 2010-12-03 LAB — URIC ACID: Uric Acid, Serum: 3.6 mg/dL (ref 2.4–7.0)

## 2010-12-03 NOTE — Assessment & Plan Note (Signed)
It is possible that her "shoulder pain" is really a cervical radiculopathy Her shoulder exam is normal Shoulder pain can be reproduced by neck extension.   sxs are improving  She will call prn.

## 2010-12-03 NOTE — Assessment & Plan Note (Signed)
Clinically stable on meds Continue same

## 2010-12-03 NOTE — Progress Notes (Signed)
  Subjective:    Patient ID: [name], female    DOB: 09-07-1919, 77 y.o.   MRN: [id number]  HPI  F/u Has left shoulder/neck pain---reviewed dr hodgin note---some improvement. Notes worsening if she turns head to the left.   PMR---no sxs  HTN---controlled  COPD---sxs are stable  Gout--no recurrence  Past Medical History  Diagnosis Date  . Allergy     rhinitis  . COPD (chronic obstructive pulmonary disease)     stage II Golds  . Hyperlipidemia   . Hypertension   . Osteoporosis   . Gout   . PMR (polymyalgia rheumatica)     cylic prednisone   Past Surgical History  Procedure Date  . Dilation and curettage of uterus   . Fractured arm     surgical repair  . Thyroglossal duct cyst     reports that she has quit smoking. She does not have any smokeless tobacco history on file. She reports that she does not drink alcohol. Her drug history not on file. family history includes Cancer in her father; Diabetes in her mother; and Heart disease in her mother. Allergies  Allergen Reactions  . Aspirin     REACTION: reflux  . Colchicine     REACTION: myalgia     Review of Systems  patient denies chest pain, shortness of breath, orthopnea. Denies lower extremity edema, abdominal pain, change in appetite, change in bowel movements. Patient denies rashes, musculoskeletal complaints. No other specific complaints in a complete review of systems.      Objective:   Physical Exam  Well-developed well-nourished female in no acute distress. HEENT exam atraumatic, normocephalic, extraocular muscles are intact. Neck is supple. No jugular venous distention no thyromegaly. Chest clear to auscultation without increased work of breathing. Cardiac exam S1 and S2 are regular. Abdominal exam active bowel sounds, soft, nontender. Extremities no edema. Neurologic exam she is alert without any motor sensory deficits. Gait is normal.        Assessment & Plan:

## 2010-12-03 NOTE — Assessment & Plan Note (Signed)
Adequate control Continue same meds 

## 2010-12-03 NOTE — Assessment & Plan Note (Signed)
She continues to do quite well Continue low dose prednisone

## 2010-12-03 NOTE — Assessment & Plan Note (Signed)
No recurrence Continue same meds

## 2010-12-09 ENCOUNTER — Telehealth: Payer: Self-pay | Admitting: *Deleted

## 2010-12-09 NOTE — Telephone Encounter (Signed)
Pt would like lab results.  She is having a gout flare, and wants Dr. Cato Mulligan to know.

## 2010-12-09 NOTE — Telephone Encounter (Signed)
Labs ok Uric acid is low

## 2010-12-09 NOTE — Telephone Encounter (Signed)
Pt given lab report.

## 2010-12-11 ENCOUNTER — Telehealth: Payer: Self-pay | Admitting: Internal Medicine

## 2010-12-11 NOTE — Telephone Encounter (Signed)
Pt called and said that she is still having a lot of pain in lft foot, great toe, and also on side of foot. Pt is req work in appt with Dr Cato Mulligan tomorrow.

## 2010-12-12 ENCOUNTER — Ambulatory Visit (INDEPENDENT_AMBULATORY_CARE_PROVIDER_SITE_OTHER): Payer: Medicare Other | Admitting: Internal Medicine

## 2010-12-12 ENCOUNTER — Encounter: Payer: Self-pay | Admitting: Internal Medicine

## 2010-12-12 DIAGNOSIS — M109 Gout, unspecified: Secondary | ICD-10-CM

## 2010-12-12 MED ORDER — METHYLPREDNISOLONE ACETATE 80 MG/ML IJ SUSP
80.0000 mg | Freq: Once | INTRAMUSCULAR | Status: AC
  Administered 2010-12-12: 80 mg via INTRAMUSCULAR

## 2010-12-12 NOTE — Telephone Encounter (Signed)
Work in with provider today

## 2010-12-12 NOTE — Progress Notes (Signed)
  Subjective:    Patient ID: [name], female    DOB: 1920/03/22, 77 y.o.   MRN: [id number]  HPI Pt presents to clinic for evaluation of toe pain. Notes 4d h/o left first toe pain with associated redness and swelling.  Known h/o gout taking allopurinol prophylaxis with no recent gout flares. No injury/trauma and is able to ambulate without difficultly. Soaked foot in epson salts and took two doses of advil 200mg  with improvement of pain and swelling and without GI adverse effect. Has demonstrated intolerance to colchicine in the past. Does take low dose prednisone 2.5mg  qd for PMR. No other alleviating or exacerbating effects. No other complaints.  Reviewed pmh, medications and allergies.   Review of Systems see hpi     Objective:   Physical Exam  [nursing notereviewed. Constitutional: She appears well-developed and well-nourished. No distress.  HENT:  Head: Normocephalic and atraumatic.  Eyes: Conjunctivae are normal. No scleral icterus.  Musculoskeletal:       Left foot: 1st mtp with redness and warmth. +tender to touch. FROM of toes. Able to wt bear and ambulate without difficulty  Neurological: She is alert.  Skin: Skin is warm and dry. No rash noted. She is not diaphoretic. There is erythema. No pallor.  Psychiatric: She has a normal mood and affect.          Assessment & Plan:

## 2010-12-12 NOTE — Assessment & Plan Note (Signed)
Exacerbation. Given depomedrol IM 80mg  today. Attempt short course of advil 400mg  po bid prn with food. Advised regarding possible gi adverse effect. Consider temporary increase of prednisone dose if no improvement.

## 2010-12-12 NOTE — Telephone Encounter (Signed)
I called pt and sch her to see Dr Rodena Medin today for gout pain, as noted.

## 2010-12-16 NOTE — Assessment & Plan Note (Signed)
[facility]                             PULMONARY OFFICE NOTE   NAME:WRIGHTBetul, [name]                     MRN:          [id number]  DATE:06/09/2007                            DOB:          February 15, 1920    [name] is an 77 year old white female, history of asthmatic  bronchitis, underlying hypertension, doing quite well, with minimal  change in her shortness of breath.  Activities of daily living are  adequate.  She is on the Advair 250/50 one spray b.i.d., Nasonex is off,  albuterol p.r.n.   ON EXAM:  Temperature 97, blood pressure 124/76, pulse 116, saturation  95% room air.  CHEST:  Showed diminished breath sounds without evidence of wheeze or  rhonchi.  CARDIAC EXAM:  Showed a regular rate and rhythm without S3, normal S1,  S2.  ABDOMEN:  Soft, nontender.  EXTREMITIES:  Showed no edema or clubbing.  SKIN:  Clear.  NEUROLOGIC EXAM:  Intact.  HEENT EXAM:  Showed no jugular venous distention, no lymphadenopathy.  Oropharynx clear.  NECK:  Supple.   IMPRESSION:  Impression in this patient is that of chronic obstructive  lung disease, asthmatic bronchitic component, stable at this time.   PLAN:  To maintain Advair as prescribed.  We will see the patient back  in followup in four months.     Charlcie Cradle Delford Field, MD, [facility]  Electronically Signed    PEW/MedQ  DD: 06/09/2007  DT: 06/10/2007  Job #: [id number]   cc:   Valetta Mole. Swords, MD

## 2010-12-16 NOTE — Assessment & Plan Note (Signed)
[facility]                             PULMONARY OFFICE NOTE   NAME:WRIGHTShelanda, [name]                     MRN:          [id number]  DATE:12/21/2006                            DOB:          1920-07-16    Ms. [name] is an 77 year old white female with history of asthmatic  bronchitis, much better today, with decreased sinusitis symptoms,  decreased cough.  She does have occasional clearing of her throat.  Maintaining Advair 1 spray b.i.d. 250/50 strength, Nasonex 2 sprays each  nostril daily.   PHYSICAL EXAMINATION:  VITAL SIGNS:  Temperature 97, blood pressure  120/80, pulse 93, saturation 98% on room air.  CHEST:  Showed to be clear, without evidence of wheeze or rhonchi.  CARDIAC:  Showed a regular rate and rhythm, without S3.  Normal S1, S2.  ABDOMEN:  Soft, nontender.  EXTREMITIES:  Showed no edema or clubbing.  SKIN:  Clear.  NEUROLOGIC:  Intact.  HEENT:  Showed no jugular venous distention, no lymphadenopathy.  Oropharynx clear.  NECK:  Supple.   IMPRESSION:  Stable obstructive lung disease.   PLAN:  Maintain inhaled medicines as currently dosed.  We will see the  patient back in followup in 3 months.     Charlcie Cradle Delford Field, MD, [facility]  Electronically Signed    PEW/MedQ  DD: 12/21/2006  DT: 12/22/2006  Job #: [id number]

## 2010-12-16 NOTE — Assessment & Plan Note (Signed)
[facility]                             PULMONARY OFFICE NOTE   NAME:[name], Ranes                     MRN:          [id number]  DATE:12/06/2006                            DOB:          Jan 04, 1920    Ms. [name] is an 77 year-old white female with a history of asthmatic  bronchitis.  She comes in today with increased cough, thick green mucus,  and increasing shortness of breath.  She maintains and fair to 250/50  one spray b.i.d.   PHYSICAL EXAMINATION:  Temp 97.8, blood pressure 118/64, pulse 120,  saturation 96% on room air.  CHEST:  Showed distant breath sounds with prolonged expiratory phase.  No wheeze or rhonchi noted.  Cardiac exam showed a regular rate and  rhythm without S3.  Normal S1 S2.  ABDOMEN:  Soft and nontender.  EXTREMITIES:  Showed no edema or clubbing.  SKIN:  Clear.   Chest x-ray obtained showed no active disease process.   IMPRESSION:  Acute bronchitic flare.   PLAN:  The patient is to receive Augmentin 875 mg twice daily for seven  days.  Mucinex 2 twice daily.  The patient will return in follow up in  three weeks.     Charlcie Cradle Delford Field, MD, [facility]  Electronically Signed    PEW/MedQ  DD: 12/06/2006  DT: 12/07/2006  Job #: [id number]   cc:   Valetta Mole. Swords, MD

## 2010-12-16 NOTE — Assessment & Plan Note (Signed)
[facility]                             PULMONARY OFFICE NOTE   NAME:WRIGHTSharyn, [name]                     MRN:          [id number]  DATE:03/01/2007                            DOB:          1920-05-16    HISTORY OF PRESENT ILLNESS:  The patient is an 77 year old white female  patient of Dr. Delford Field, who has a known history of asthmatic bronchitis.  Presents today with a 1-week history of cough, congestion.  The patient  denies any hemoptysis, chest pain, orthopnea, PND or leg swelling.  The  patient has recently returned from her vacation in [place], and reports  that she was doing exceptionally well until the last week.   PAST MEDICAL HISTORY:  Reviewed.   CURRENT MEDICATIONS:  Reviewed.   PHYSICAL EXAM:  The patient is a pleasant female in no acute distress.  She is afebrile with stable vital signs.  Saturation 96% on room air.  HEENT:  Nasal mucosa with a mild erythema.  Nontender sinuses.  Posterior pharynx clear.  NECK:  Supple without cervical adenopathy.  No JVD.  LUNGS:  Sounds reveal diminished breath sounds at the bases, otherwise  clear.  CARDIAC:  Regular rate.  ABDOMEN:  Soft and nontender.  EXTREMITIES:  Warm without any edema.   IMPRESSION/PLAN:  Acute upper respiratory infection.  Patient given  Augmentin x7 days, Mucinex DM twice a day.  Add saline nasal spray to  the present regimen.  The patient will return back with Dr. Delford Field as  scheduled, or sooner if needed.      Rubye Oaks, NP  Electronically Signed      Charlcie Cradle Delford Field, MD, [facility]  Electronically Signed   TP/MedQ  DD: 03/01/2007  DT: 03/02/2007  Job #: [id number]

## 2010-12-19 NOTE — Assessment & Plan Note (Signed)
[facility]                             PULMONARY OFFICE NOTE   NAME:WRIGHTGwendalyn, Mcgonagle                     MRN:          [id number]  DATE:08/23/2006                            DOB:          August 14, 1919    Ms. Faiella is an 77 year old white female with history of asthmatic  bronchitis. Patient notes some dyspnea with exertion, no cough.  Maintains Advair 250/50 1 spray b.i.d., Micardis 80 mg daily, HCTZ  daily, Nasonex p.r.n.   PHYSICAL EXAMINATION:  VITAL SIGNS:  Temp 97.8, blood pressure 122/80,  pulse 88, saturations 96% on room air.  CHEST:  Showed distant breath sounds without evidence of wheeze or  rhonchi.  CARDIAC:  Showed regular rate and rhythm without S3, normal S1, S2.  ABDOMEN:  Soft, nontender.  EXTREMITIES:  No edema or clubbing.  SKIN:  Clear.  NEUROLOGIC:  Intact.  HEENT:  Showed no jugular venous distention, lymphadenopathy, oropharynx  clear.  NECK:  Supple.   IMPRESSION:  Stable asthmatic bronchitis.   PLAN:  To maintain inhaled medicines as currently dosed. Will see the  patient back. Return followup in three months.     Charlcie Cradle Delford Field, MD, [facility]  Electronically Signed    PEW/MedQ  DD: 08/23/2006  DT: 08/23/2006  Job #: [id number]   cc:   Valetta Mole. Swords, MD

## 2010-12-19 NOTE — Assessment & Plan Note (Signed)
[facility]                               PULMONARY OFFICE NOTE   NAME:[name], [name]                     MRN:          [id number]  DATE:05/12/2006                            DOB:          1919-09-20    HISTORY OF PRESENT ILLNESS:  [name] is an 77 year old white female with  history of chronic obstructive lung disease, stable at this time.  The  patient states that her level of dyspnea is satisfactory on the Advair  250/50 one spray b.i.d., Nasonex p.r.n.   PHYSICAL EXAMINATION:  VITAL SIGNS:  Temperature 98.7, blood pressure  130/72, pulse 84, saturation 97% on room air.  CHEST:  Distant breath sounds.  No evidence of wheeze or rhonchi.  CARDIAC:  Regular rate and rhythm without S3.  Normal S1, S2.  ABDOMEN:  Soft, nontender.  EXTREMITIES:  No edema or clubbing.  SKIN:  Clear.  NEUROLOGICAL:  Intact.  HEENT:  No jugular venous distention.  No lymphadenopathy.  Oropharynx  clear.  NECK:  Supple.   IMPRESSION:  Chronic obstructive lung disease, stable at this time.   PLAN:  Maintain Advair as currently dosed.  Will see the patient back in  return follow up.       Charlcie Cradle Delford Field, MD, [facility]      PEW/MedQ  DD:  05/12/2006  DT:  05/14/2006  Job #:  [id number]   cc:   Valetta Mole. Swords, MD

## 2010-12-19 NOTE — Assessment & Plan Note (Signed)
[facility]                               PULMONARY OFFICE NOTE   NAME:[name], [name]                     MRN:          [id number]  DATE:03/30/2006                            DOB:          1920-07-01    [name] is an 77 year old white female with a history of asthmatic  bronchitis.  Overall, the patient is improved with decreased shortness of  breath.  Has had some wheezing in the heat.  Maintains Nasonex two sprays,  each nostril, daily, Advair 250/50 1 spray b.i.d.   PHYSICAL EXAMINATION:  VITAL SIGNS:  Temp 98, blood pressure 120/78, pulse  98, saturation 96% on room air.  CHEST:  Diminished breath sounds with prolonged expiratory phase.  No wheeze  or rhonchi noted.  CARDIAC:  Regular rate and rhythm without S3.  Normal S1 and S2.  ABDOMEN:  Soft and nontender.  EXTREMITIES:  No clubbing or edema.  SKIN:  Clear.  NEUROLOGIC:  Intact.   Spirometry showed essentially normal spirometry with an FEV1 of 108%  predicted, FVC 107% predicted.   IMPRESSION:  Stable chronic obstructive lung disease.   PLAN:  Patient is to maintain inhaled medicines as currently dosed and will  see the patient back in return followup.                                   Charlcie Cradle Delford Field, MD, [facility]   PEW/MedQ  DD:  03/30/2006  DT:  03/31/2006  Job #:  [id number]

## 2010-12-19 NOTE — Assessment & Plan Note (Signed)
[facility]                             PULMONARY OFFICE NOTE   NAME:WRIGHTEriko, Economos                     MRN:          [id number]  DATE:11/03/2006                            DOB:          11/17/1919    Ms. [name], seen today in followup complained of increased chest  tightness, pain from the shoulder blades, nasal drainage, increased  cough that is dry.   She is on  1. Advair 250/50 one spray b.i.d.  2. Dyazide daily.  3. Centrum daily.  4. Albuterol p.r.n.   EXAMINATION:  Temperature is 98, blood pressure 130/68, pulse 104,  saturation 97% room air.  CHEST:  Showed distant breath sounds, few expired wheezes, poor airflow.  CARDIAC:  Regular rate and rhythm, without S3.  Normal S1, S2.  ABDOMEN:  Soft, nontender.  EXTREMITIES:  Showed no edema or clubbing.  SKIN:  Clear.  NEUROLOGIC:  Intact.   IMPRESSION:  Moderate persistent asthma with acute asthmatic flare due  to allergic rhinitis and chronic allergies.   PLAN:  The patient to resume Nasonex 2 sprays each nostril daily,  continue Advair as is.  Pulse prednisone at 40 mg a day, taper down by  10 mg every 2 days until of.  Note is also made of a recent radiograph  of the spine showing osteopenia and compression fractures of the T-  spine.  Given the fact the patient is on Advair we may wish to consider  osteoporosis medication.  We will discuss this with Dr. Cato Mulligan.     Isabel Cradle Isabel Field, MD, [facility]  Electronically Signed    PEW/MedQ  DD: 11/03/2006  DT: 11/03/2006  Job #: [id number]   cc:   Valetta Mole. Swords, MD

## 2011-01-06 ENCOUNTER — Other Ambulatory Visit: Payer: Self-pay | Admitting: Internal Medicine

## 2011-02-24 ENCOUNTER — Ambulatory Visit (INDEPENDENT_AMBULATORY_CARE_PROVIDER_SITE_OTHER): Payer: Medicare Other | Admitting: Critical Care Medicine

## 2011-02-24 ENCOUNTER — Encounter: Payer: Self-pay | Admitting: Critical Care Medicine

## 2011-02-24 VITALS — BP 120/60 | HR 85 | Temp 98.4°F | Wt 148.0 lb

## 2011-02-24 DIAGNOSIS — J4489 Other specified chronic obstructive pulmonary disease: Secondary | ICD-10-CM

## 2011-02-24 DIAGNOSIS — J449 Chronic obstructive pulmonary disease, unspecified: Secondary | ICD-10-CM

## 2011-02-24 MED ORDER — FLUTICASONE-SALMETEROL 250-50 MCG/DOSE IN AEPB: 1.0000 | INHALATION_SPRAY | Freq: Two times a day (BID) | RESPIRATORY_TRACT | Status: DC

## 2011-02-24 NOTE — Patient Instructions (Signed)
No change in medications. Return in        6 months        

## 2011-02-24 NOTE — Progress Notes (Signed)
Subjective:    Patient ID: [name], female    DOB: 1919/09/22, 77 y.o.   MRN: [id number]  HPI 77 y.o.  WF Copd Dyspnea ok,  Heat an issue, not much cough ,just pn drip.  Using saline.  No real chest pain.  Min edema in feet. Past Medical History  Diagnosis Date  . Allergy     rhinitis  . COPD (chronic obstructive pulmonary disease)     stage II Golds  . Hyperlipidemia   . Hypertension   . Osteoporosis   . Gout   . PMR (polymyalgia rheumatica)     cylic prednisone     Family History  Problem Relation Age of Onset  . Heart disease Mother   . Diabetes Mother   . Cancer Father     lung     History   Social History  . Marital Status: Widowed    Spouse Name: N/A    Number of Children: N/A  . Years of Education: N/A   Occupational History  . Not on file.   Social History Main Topics  . Smoking status: Former Games developer  . Smokeless tobacco: Not on file  . Alcohol Use: No  . Drug Use:   . Sexually Active:    Other Topics Concern  . Not on file   Social History Narrative  . No narrative on file     Allergies  Allergen Reactions  . Aspirin     REACTION: reflux  . Colchicine     REACTION: myalgia     Outpatient Prescriptions Prior to Visit  Medication Sig Dispense Refill  . albuterol (PROAIR HFA) 108 (90 BASE) MCG/ACT inhaler Inhale 2 puffs into the lungs as needed.        Marland Kitchen allopurinol (ZYLOPRIM) 300 MG tablet TAKE 1 TABLET BY MOUTH DAILY  30 tablet  3  . BENICAR 40 MG tablet TAKE ONE TABLET BY MOUTH DAILY  30 tablet  5  . labetalol (NORMODYNE) 200 MG tablet TAKE ONE TABLET BY MOUTH TWICE DAILY  180 tablet  2  . Multiple Vitamins-Minerals (CENTRUM SILVER PO) Take by mouth daily.        . predniSONE (DELTASONE) 2.5 MG tablet Take one pill every day  90 tablet  3  . VITAMIN D, CHOLECALCIFEROL, PO Take 1,000 Units by mouth daily.        . Fluticasone-Salmeterol (ADVAIR DISKUS) 250-50 MCG/DOSE AEPB Inhale 1 puff into the lungs 2 (two) times daily.             Review of Systems Constitutional:   No  weight loss, night sweats,  Fevers, chills, fatigue, lassitude. HEENT:   No headaches,  Difficulty swallowing,  Tooth/dental problems,  Sore throat,                No sneezing, itching, ear ache, nasal congestion, post nasal drip,   CV:  No chest pain,  Orthopnea, PND, notes mild swelling in lower extremities, no anasarca, dizziness, palpitations  GI  No heartburn, indigestion, abdominal pain, nausea, vomiting, diarrhea, change in bowel habits, loss of appetite  Resp: Notes  shortness of breath with exertion not  at rest.  No excess mucus, no productive cough,  No non-productive cough,  No coughing up of blood.  No change in color of mucus.  No wheezing.  No chest wall deformity  Skin: no rash or lesions.  GU: no dysuria, change in color of urine, no urgency or frequency.  No flank pain.  MS:  No joint pain or swelling.  No decreased range of motion.  No back pain.  Psych:  No change in mood or affect. No depression or anxiety.  No memory loss.     Objective:   Physical Exam Filed Vitals:   02/24/11 1044  BP: 120/60  Pulse: 85  Temp: 98.4 F (36.9 C)  TempSrc: Oral  Weight: 148 lb (67.132 kg)  SpO2: 95%    Gen: Pleasant, well-nourished, in no distress,  normal affect  ENT: No lesions,  mouth clear,  oropharynx clear, no postnasal drip  Neck: No JVD, no TMG, no carotid bruits  Lungs: No use of accessory muscles, no dullness to percussion, distant BS  Cardiovascular: RRR, heart sounds normal, no murmur or gallops, no peripheral edema  Abdomen: soft and NT, no HSM,  BS normal  Musculoskeletal: No deformities, no cyanosis or clubbing  Neuro: alert, non focal  Skin: Warm, no lesions or rashes       PFT Conversion 12/20/2009  FVC 1.71  FVC PREDICT   FVC  % Predicted 102.4  FEV1 1.21  FEV1 PREDICT 1.20  FEV % Predicted 100.3  FEV1/FVC 70.8  FEV1/FVC PRE   FEV1/FVC%EXP 97.60  FeF 25-75   FeF 25-75 % Predicted    FEF % EXPEC   PEF % EXPECT 3.29    Assessment & Plan:   COPD Stable copd Plan No change in inhaled or maintenance medications. Return in  6 months Refill inhalers    Updated Medication List Outpatient Encounter Prescriptions as of 02/24/2011  Medication Sig Dispense Refill  . albuterol (PROAIR HFA) 108 (90 BASE) MCG/ACT inhaler Inhale 2 puffs into the lungs as needed.        Marland Kitchen allopurinol (ZYLOPRIM) 300 MG tablet TAKE 1 TABLET BY MOUTH DAILY  30 tablet  3  . BENICAR 40 MG tablet TAKE ONE TABLET BY MOUTH DAILY  30 tablet  5  . Fluticasone-Salmeterol (ADVAIR DISKUS) 250-50 MCG/DOSE AEPB Inhale 1 puff into the lungs 2 (two) times daily.  60 each  6  . labetalol (NORMODYNE) 200 MG tablet TAKE ONE TABLET BY MOUTH TWICE DAILY  180 tablet  2  . Multiple Vitamins-Minerals (CENTRUM SILVER PO) Take by mouth daily.        . predniSONE (DELTASONE) 2.5 MG tablet Take one pill every day  90 tablet  3  . VITAMIN D, CHOLECALCIFEROL, PO Take 1,000 Units by mouth daily.        Marland Kitchen DISCONTD: Fluticasone-Salmeterol (ADVAIR DISKUS) 250-50 MCG/DOSE AEPB Inhale 1 puff into the lungs 2 (two) times daily.

## 2011-02-24 NOTE — Assessment & Plan Note (Signed)
Stable copd Plan No change in inhaled or maintenance medications. Return in  6 months Refill inhalers

## 2011-03-16 ENCOUNTER — Encounter: Payer: Self-pay | Admitting: Internal Medicine

## 2011-03-16 ENCOUNTER — Ambulatory Visit (INDEPENDENT_AMBULATORY_CARE_PROVIDER_SITE_OTHER): Payer: Medicare Other | Admitting: Internal Medicine

## 2011-03-16 ENCOUNTER — Other Ambulatory Visit: Payer: Self-pay | Admitting: Internal Medicine

## 2011-03-16 DIAGNOSIS — I10 Essential (primary) hypertension: Secondary | ICD-10-CM

## 2011-03-16 DIAGNOSIS — M353 Polymyalgia rheumatica: Secondary | ICD-10-CM

## 2011-03-16 DIAGNOSIS — M109 Gout, unspecified: Secondary | ICD-10-CM

## 2011-03-16 LAB — BASIC METABOLIC PANEL
BUN: 24 mg/dL — ABNORMAL HIGH (ref 6–23)
CO2: 25 mEq/L (ref 19–32)
Calcium: 9 mg/dL (ref 8.4–10.5)
Chloride: 106 mEq/L (ref 96–112)
Creatinine, Ser: 0.9 mg/dL (ref 0.4–1.2)
Glucose, Bld: 103 mg/dL — ABNORMAL HIGH (ref 70–99)

## 2011-03-16 LAB — URIC ACID: Uric Acid, Serum: 4 mg/dL (ref 2.4–7.0)

## 2011-03-16 NOTE — Assessment & Plan Note (Signed)
i don't think any of her sxs are PMR related---will check labs today

## 2011-03-16 NOTE — Assessment & Plan Note (Signed)
No recurrence i doubt that right foot pain is gout related---no treatment at this time

## 2011-03-16 NOTE — Progress Notes (Signed)
  Subjective:    Patient ID: [name], female    DOB: 08/16/1919, 77 y.o.   MRN: [id number]  HPI  Patient Active Problem List  Diagnoses  . HYPERLIPIDEMIA---on no medications  . GOUT--no known recurrence but she does admit to occasional exertional right foot pain---minimal to moderate. No trauma  . HYPERTENSION--tolerating meds  . COPD--has regualr f/u with dr Delford Field  . OSTEOARTHRITIS, KNEE  . Polymyalgia rheumatica---low dose prednisone. She does admit to mild aches and pains but these are localized---currently limited to Right arm for 2 weeks---around lateral epicondyle (started after working overhead for a few hours)  . OSTEOPOROSIS    Past Medical History  Diagnosis Date  . Allergy     rhinitis  . COPD (chronic obstructive pulmonary disease)     stage II Golds  . Hyperlipidemia   . Hypertension   . Osteoporosis   . Gout   . PMR (polymyalgia rheumatica)     cylic prednisone   Past Surgical History  Procedure Date  . Dilation and curettage of uterus   . Fractured arm     surgical repair  . Thyroglossal duct cyst     reports that she has quit smoking. She does not have any smokeless tobacco history on file. She reports that she does not drink alcohol. Her drug history not on file. family history includes Cancer in her father; Diabetes in her mother; and Heart disease in her mother. Allergies  Allergen Reactions  . Aspirin     REACTION: reflux  . Colchicine     REACTION: myalgia      Review of Systems  patient denies chest pain, shortness of breath, orthopnea. Denies lower extremity edema, abdominal pain, change in appetite, change in bowel movements. Patient denies rashes, musculoskeletal complaints. No other specific complaints in a complete review of systems.      Objective:   Physical Exam   Well-developed well-nourished female in no acute distress. HEENT exam atraumatic, normocephalic, extraocular muscles are intact. Neck is supple. No jugular venous  distention no thyromegaly. Chest clear to auscultation without increased work of breathing. Cardiac exam S1 and S2 are regular. Abdominal exam active bowel sounds, soft, nontender. Extremities trace edema of feet only. edema.       Assessment & Plan:

## 2011-03-16 NOTE — Assessment & Plan Note (Signed)
Reasonable control Continue same meds 

## 2011-03-25 ENCOUNTER — Telehealth: Payer: Self-pay | Admitting: Internal Medicine

## 2011-03-25 NOTE — Telephone Encounter (Signed)
Fibromyalgia  R arm going into muscles and shoulder is very painful pt is taking medication for it but it is not helping. Pt would like to know what she should do. Please contact. Pt said it would be ok to leave message.

## 2011-03-25 NOTE — Telephone Encounter (Signed)
Please advise 

## 2011-03-26 NOTE — Telephone Encounter (Signed)
Likely a radiculopathy Cervical spine MRI without contrast

## 2011-03-27 ENCOUNTER — Ambulatory Visit (INDEPENDENT_AMBULATORY_CARE_PROVIDER_SITE_OTHER): Payer: Medicare Other | Admitting: Family Medicine

## 2011-03-27 ENCOUNTER — Encounter: Payer: Self-pay | Admitting: Family Medicine

## 2011-03-27 VITALS — BP 124/74 | HR 90 | Temp 98.2°F | Wt 149.0 lb

## 2011-03-27 DIAGNOSIS — M7150 Other bursitis, not elsewhere classified, unspecified site: Secondary | ICD-10-CM

## 2011-03-27 DIAGNOSIS — M719 Bursopathy, unspecified: Secondary | ICD-10-CM

## 2011-03-27 MED ORDER — METHYLPREDNISOLONE ACETATE 80 MG/ML IJ SUSP
120.0000 mg | Freq: Once | INTRAMUSCULAR | Status: AC
  Administered 2011-03-27: 120 mg via INTRAMUSCULAR

## 2011-03-27 NOTE — Progress Notes (Signed)
  Subjective:    Patient ID: [name], female    DOB: 1920-03-30, 77 y.o.   MRN: [id number]  HPI Here for one week of stiffness and pain in the right shoulder and upper arm. No recent trauma, but she admits to standing on a stool and reaching up over her head for 2 hours cleaning pictures on a wall just before this pain started. Using heat and Advil.    Review of Systems  Constitutional: Negative.   Musculoskeletal: Positive for arthralgias.       Objective:   Physical Exam  Constitutional: She appears well-developed and well-nourished.  Musculoskeletal:       The right shoulder is tender anteriorly and over the deltoid area. No crepitus. ROM is fairly limited by pain           Assessment & Plan:  Bursitis form overuse. Given a steroid shot today. Rest. Recheck prn

## 2011-03-27 NOTE — Progress Notes (Signed)
Addended by: Aniceto Boss A on: 03/27/2011 02:42 PM   Modules accepted: Orders

## 2011-04-08 ENCOUNTER — Encounter: Payer: Self-pay | Admitting: Family Medicine

## 2011-04-08 ENCOUNTER — Ambulatory Visit (INDEPENDENT_AMBULATORY_CARE_PROVIDER_SITE_OTHER): Payer: Medicare Other | Admitting: Family Medicine

## 2011-04-08 VITALS — BP 126/70 | HR 91 | Temp 98.6°F | Wt 147.0 lb

## 2011-04-08 DIAGNOSIS — R05 Cough: Secondary | ICD-10-CM

## 2011-04-08 DIAGNOSIS — J40 Bronchitis, not specified as acute or chronic: Secondary | ICD-10-CM

## 2011-04-08 MED ORDER — AZITHROMYCIN 250 MG PO TABS: ORAL_TABLET | ORAL | Status: AC

## 2011-04-12 ENCOUNTER — Other Ambulatory Visit: Payer: Self-pay | Admitting: Internal Medicine

## 2011-04-13 ENCOUNTER — Encounter: Payer: Self-pay | Admitting: Family Medicine

## 2011-04-13 ENCOUNTER — Ambulatory Visit: Payer: PRIVATE HEALTH INSURANCE | Admitting: Internal Medicine

## 2011-04-13 NOTE — Progress Notes (Signed)
  Subjective:    Patient ID: [name], female    DOB: 1919/12/01, 77 y.o.   MRN: [id number]  HPI Here for one week of chest congestion, PND, and a dry cough. No fever.    Review of Systems  Constitutional: Negative.   HENT: Positive for congestion, postnasal drip and sinus pressure.   Eyes: Negative.   Respiratory: Positive for cough.   Cardiovascular: Negative.        Objective:   Physical Exam  Constitutional: She appears well-developed and well-nourished.  HENT:  Right Ear: External ear normal.  Left Ear: External ear normal.  Nose: Nose normal.  Mouth/Throat: Oropharynx is clear and moist. No oropharyngeal exudate.  Eyes: Conjunctivae are normal. Pupils are equal, round, and reactive to light.  Neck: Neck supple. No thyromegaly present.  Pulmonary/Chest: Effort normal and breath sounds normal. No respiratory distress. She has no wheezes. She has no rales. She exhibits no tenderness.  Lymphadenopathy:    She has no cervical adenopathy.          Assessment & Plan:  Rest, fluids

## 2011-06-05 ENCOUNTER — Ambulatory Visit (INDEPENDENT_AMBULATORY_CARE_PROVIDER_SITE_OTHER): Payer: Medicare Other | Admitting: Internal Medicine

## 2011-06-05 ENCOUNTER — Encounter: Payer: Self-pay | Admitting: Internal Medicine

## 2011-06-05 VITALS — BP 128/72 | HR 76 | Temp 98.4°F | Ht 59.0 in | Wt 150.0 lb

## 2011-06-05 DIAGNOSIS — Z23 Encounter for immunization: Secondary | ICD-10-CM

## 2011-06-05 DIAGNOSIS — R609 Edema, unspecified: Secondary | ICD-10-CM

## 2011-06-05 DIAGNOSIS — R600 Localized edema: Secondary | ICD-10-CM | POA: Insufficient documentation

## 2011-06-05 LAB — CBC WITH DIFFERENTIAL/PLATELET
Basophils Relative: 0.3 % (ref 0.0–3.0)
Eosinophils Relative: 0.9 % (ref 0.0–5.0)
HCT: 37.5 % (ref 36.0–46.0)
Hemoglobin: 12.9 g/dL (ref 12.0–15.0)
Lymphs Abs: 1.2 10*3/uL (ref 0.7–4.0)
MCV: 95.1 fl (ref 78.0–100.0)
Monocytes Absolute: 0.5 10*3/uL (ref 0.1–1.0)
Monocytes Relative: 10.1 % (ref 3.0–12.0)
Neutro Abs: 3 10*3/uL (ref 1.4–7.7)
RBC: 3.94 Mil/uL (ref 3.87–5.11)
WBC: 4.7 10*3/uL (ref 4.5–10.5)

## 2011-06-05 LAB — HEPATIC FUNCTION PANEL
AST: 21 U/L (ref 0–37)
Albumin: 3.9 g/dL (ref 3.5–5.2)
Total Bilirubin: 0.6 mg/dL (ref 0.3–1.2)

## 2011-06-05 LAB — BASIC METABOLIC PANEL
BUN: 20 mg/dL (ref 6–23)
Calcium: 9.1 mg/dL (ref 8.4–10.5)
GFR: 79.78 mL/min (ref >60.00)
Glucose, Bld: 91 mg/dL (ref 70–99)
Potassium: 4.4 mEq/L (ref 3.5–5.1)
Sodium: 140 mEq/L (ref 135–145)

## 2011-06-05 NOTE — Progress Notes (Signed)
  Subjective:    Patient ID: [name], female    DOB: 28-Nov-1919, 77 y.o.   MRN: [id number]  HPI Patient Active Problem List  Diagnoses  . HYPERLIPIDEMIA---no meds  . GOUT---no recurrence. Tophi on foot has completley healed  . HYPERTENSION---tolerating meds. She has noted some swelling of both feet. She describes dependent edema  . COPD---has done quite well, no wheezing  .   Marland Kitchen Polymyalgia rheumatica---low dose prednisone  . OSTEOPOROSIS   Past Medical History  Diagnosis Date  . Allergy     rhinitis  . COPD (chronic obstructive pulmonary disease)     stage II Golds  . Hyperlipidemia   . Hypertension   . Osteoporosis   . Gout   . PMR (polymyalgia rheumatica)     cylic prednisone   Past Surgical History  Procedure Date  . Dilation and curettage of uterus   . Fractured arm     surgical repair  . Thyroglossal duct cyst     reports that she has quit smoking. She has never used smokeless tobacco. She reports that she drinks alcohol. She reports that she does not use illicit drugs. family history includes Cancer in her father; Diabetes in her mother; and Heart disease in her mother. Allergies  Allergen Reactions  . Aspirin     REACTION: reflux  . Colchicine     REACTION: myalgia      Review of Systems  patient denies chest pain, shortness of breath, orthopnea. Denies lower extremity edema, abdominal pain, change in appetite, change in bowel movements. Patient denies rashes, musculoskeletal complaints. No other specific complaints in a complete review of systems.      Objective:   Physical Exam   Well-developed well-nourished female in no acute distress. HEENT exam atraumatic, normocephalic, extraocular muscles are intact. Neck is supple. No jugular venous distention no thyromegaly. Chest clear to auscultation without increased work of breathing. Cardiac exam S1 and S2 are regular. Abdominal exam active bowel sounds, soft, nontender. Extremities no 2 + edema to  ankles     Assessment & Plan:

## 2011-06-05 NOTE — Assessment & Plan Note (Signed)
i suspect related to venous insufficiency No obvious meds Will check labs

## 2011-06-10 ENCOUNTER — Other Ambulatory Visit: Payer: Self-pay | Admitting: Internal Medicine

## 2011-06-10 ENCOUNTER — Telehealth: Payer: Self-pay | Admitting: Internal Medicine

## 2011-06-10 DIAGNOSIS — M7989 Other specified soft tissue disorders: Secondary | ICD-10-CM

## 2011-06-10 NOTE — Telephone Encounter (Signed)
Pt states that her feet are still swollen.  She wanted to know what the next step was

## 2011-06-11 NOTE — Telephone Encounter (Signed)
Send for bilateral Lower Extremity venous ultrasound--- ? Venous insufficiency.  Have her start wearing knee high compressive stockings (20 mmHg).

## 2011-06-11 NOTE — Telephone Encounter (Signed)
Order placed, pt aware of Dr Cato Mulligan instructions

## 2011-06-16 ENCOUNTER — Ambulatory Visit: Payer: Medicare Other | Admitting: Internal Medicine

## 2011-06-19 ENCOUNTER — Encounter (INDEPENDENT_AMBULATORY_CARE_PROVIDER_SITE_OTHER): Payer: Medicare Other | Admitting: *Deleted

## 2011-06-19 DIAGNOSIS — R609 Edema, unspecified: Secondary | ICD-10-CM

## 2011-06-19 DIAGNOSIS — M7989 Other specified soft tissue disorders: Secondary | ICD-10-CM

## 2011-07-03 ENCOUNTER — Telehealth: Payer: Self-pay | Admitting: Internal Medicine

## 2011-07-03 NOTE — Telephone Encounter (Signed)
Pt feet is still swollen decline to see another doc. Where can I work pt in?

## 2011-07-05 NOTE — Telephone Encounter (Signed)
Next available appointment or appointment with a physician.

## 2011-07-08 ENCOUNTER — Other Ambulatory Visit: Payer: Self-pay | Admitting: Internal Medicine

## 2011-07-09 NOTE — Telephone Encounter (Signed)
sch for 07/14/2011 9.30am

## 2011-07-14 ENCOUNTER — Encounter: Payer: Self-pay | Admitting: Family Medicine

## 2011-07-14 ENCOUNTER — Ambulatory Visit (INDEPENDENT_AMBULATORY_CARE_PROVIDER_SITE_OTHER): Payer: Medicare Other | Admitting: Family Medicine

## 2011-07-14 ENCOUNTER — Telehealth: Payer: Self-pay | Admitting: Internal Medicine

## 2011-07-14 ENCOUNTER — Ambulatory Visit: Payer: Medicare Other | Admitting: Family Medicine

## 2011-07-14 VITALS — BP 138/70 | HR 84 | Temp 98.7°F | Wt 151.0 lb

## 2011-07-14 DIAGNOSIS — R600 Localized edema: Secondary | ICD-10-CM

## 2011-07-14 DIAGNOSIS — E785 Hyperlipidemia, unspecified: Secondary | ICD-10-CM

## 2011-07-14 DIAGNOSIS — I10 Essential (primary) hypertension: Secondary | ICD-10-CM

## 2011-07-14 DIAGNOSIS — R609 Edema, unspecified: Secondary | ICD-10-CM

## 2011-07-14 LAB — BASIC METABOLIC PANEL
BUN: 26 mg/dL — ABNORMAL HIGH (ref 6–23)
Calcium: 9.1 mg/dL (ref 8.4–10.5)
Creatinine, Ser: 0.8 mg/dL (ref 0.4–1.2)
GFR: 68.78 mL/min (ref >60.00)
Glucose, Bld: 95 mg/dL (ref 70–99)
Potassium: 4.4 mEq/L (ref 3.5–5.1)

## 2011-07-14 MED ORDER — FUROSEMIDE 20 MG PO TABS: 20.0000 mg | ORAL_TABLET | Freq: Every day | ORAL | Status: DC

## 2011-07-14 NOTE — Telephone Encounter (Signed)
Pt will return to have her labs drawn on 07/15/11. Pt is aware.

## 2011-07-14 NOTE — Progress Notes (Signed)
  Subjective:    Patient ID: [name], female    DOB: 12/26/1919, 77 y.o.   MRN: [id number]  HPI Pateint is in today for a recheck of lower extremity edema that has been present for about 1 month. She has lower extremity dopplers that were normal. She reports using her support hose and reducing her sodium intake. Despite this, she continues to have edema.    Review of Systems  Constitutional: Negative.   Respiratory: Negative.   Cardiovascular: Positive for leg swelling. Negative for chest pain and palpitations.  Genitourinary: Negative.   Neurological: Negative.   Psychiatric/Behavioral: Negative.        Objective:   Physical Exam  Constitutional: She is oriented to person, place, and time. She appears well-developed and well-nourished.  Neck: Normal range of motion. Neck supple.  Cardiovascular: Normal rate, regular rhythm and normal heart sounds.   Pulmonary/Chest: Effort normal and breath sounds normal.  Abdominal: Soft. Bowel sounds are normal.  Musculoskeletal: She exhibits edema.       2-3/ pitting edema bilaterally.  Neurological: She is alert and oriented to person, place, and time.  Skin: Skin is warm and dry.  Psychiatric: She has a normal mood and affect.          Assessment & Plan:  Assessment: Edema  Plan: I will try patient on a trial of lasix 20mg  QD x 10 days. She will return in 10 days for a recheck. In the meantime, she will continue to wear support hose, reduce her sodium intake, and elevate her feet.  Labs sent to evaluate renal function and r/o CHF.

## 2011-07-14 NOTE — Patient Instructions (Signed)
Edema Edema is an abnormal build-up of fluids in tissues. Because this is partly dependent on gravity (water flows to the lowest place), it is more common in the leg sand thighs (lower extremities). It is also common in the looser tissues, like around the eyes. Painless swelling of the feet and ankles is common and increases as a person ages. It may affect both legs and may include the calves or even thighs. When squeezed, the fluid may move out of the affected area and may leave a dent for a few moments. CAUSES   Prolonged standing or sitting in one place for extended periods of time. Movement helps pump tissue fluid into the veins, and absence of movement prevents this, resulting in edema.   Varicose veins. The valves in the veins do not work as well as they should. This causes fluid to leak into the tissues.   Fluid and salt overload.   Injury, burn, or surgery to the leg, ankle, or foot, may damage veins and allow fluid to leak out.   Sunburn damages vessels. Leaky vessels allow fluid to go out into the sunburned tissues.   Allergies (from insect bites or stings, medications or chemicals) cause swelling by allowing vessels to become leaky.   Protein in the blood helps keep fluid in your vessels. Low protein, as in malnutrition, allows fluid to leak out.   Hormonal changes, including pregnancy and menstruation, cause fluid retention. This fluid may leak out of vessels and cause edema.   Medications that cause fluid retention. Examples are sex hormones, blood pressure medications, steroid treatment, or anti-depressants.   Some illnesses cause edema, especially heart failure, kidney disease, or liver disease.   Surgery that cuts veins or lymph nodes, such as surgery done for the heart or for breast cancer, may result in edema.  DIAGNOSIS  Your caregiver is usually easily able to determine what is causing your swelling (edema) by simply asking what is wrong (getting a history) and examining  you (doing a physical). Sometimes x-rays, EKG (electrocardiogram or heart tracing), and blood work may be done to evaluate for underlying medical illness. TREATMENT  General treatment includes:  Leg elevation (or elevation of the affected body part).   Restriction of fluid intake.   Prevention of fluid overload.   Compression of the affected body part. Compression with elastic bandages or support stockings squeezes the tissues, preventing fluid from entering and forcing it back into the blood vessels.   Diuretics (also called water pills or fluid pills) pull fluid out of your body in the form of increased urination. These are effective in reducing the swelling, but can have side effects and must be used only under your caregiver's supervision. Diuretics are appropriate only for some types of edema.  The specific treatment can be directed at any underlying causes discovered. Heart, liver, or kidney disease should be treated appropriately. HOME CARE INSTRUCTIONS   Elevate the legs (or affected body part) above the level of the heart, while lying down.   Avoid sitting or standing still for prolonged periods of time.   Avoid putting anything directly under the knees when lying down, and do not wear constricting clothing or garters on the upper legs.   Exercising the legs causes the fluid to work back into the veins and lymphatic channels. This may help the swelling go down.   The pressure applied by elastic bandages or support stockings can help reduce ankle swelling.   A low-salt diet may help reduce fluid   retention and decrease the ankle swelling.   Take any medications exactly as prescribed.  SEEK MEDICAL CARE IF:  Your edema is not responding to recommended treatments. SEEK IMMEDIATE MEDICAL CARE IF:   You develop shortness of breath or chest pain.   You cannot breathe when you lay down; or if, while lying down, you have to get up and go to the window to get your breath.   You  are having increasing swelling without relief from treatment.   You develop a fever over 102 F (38.9 C).   You develop pain or redness in the areas that are swollen.   Tell your caregiver right away if you have gained 3 lb/1.4 kg in 1 day or 5 lb/2.3 kg in a week.  MAKE SURE YOU:   Understand these instructions.   Will watch your condition.   Will get help right away if you are not doing well or get worse.  Document Released: 07/20/2005 Document Revised: 04/01/2011 Document Reviewed: 03/07/2008 [employer] Patient Information 2012 [employer]. 

## 2011-07-14 NOTE — Telephone Encounter (Signed)
Pt called and said that she is returning someone's call from [facility]. Pt is req that she rcv a call back.

## 2011-07-27 ENCOUNTER — Ambulatory Visit (INDEPENDENT_AMBULATORY_CARE_PROVIDER_SITE_OTHER): Payer: Medicare Other | Admitting: Family Medicine

## 2011-07-27 ENCOUNTER — Encounter: Payer: Self-pay | Admitting: Family Medicine

## 2011-07-27 VITALS — BP 136/60 | HR 81 | Temp 97.6°F | Wt 150.0 lb

## 2011-07-27 DIAGNOSIS — R609 Edema, unspecified: Secondary | ICD-10-CM

## 2011-07-27 NOTE — Progress Notes (Signed)
Subjective:    Patient ID: [name], female    DOB: 23-Oct-1919, 77 y.o.   MRN: [id number]  HPI [name] is an 77 year old white female, former smoker, patient of Dr. Cato Mulligan in for recheck of bilateral edema. She has been on Lasix for about a week, and has responded very well. Edema is significantly improved. She denies any side effects. Tolerating medication well. She has an appointment 08/18/2011 with her PCP.  Review of Systems  Constitutional: Negative.   HENT: Negative.   Eyes: Negative.   Respiratory: Negative.   Cardiovascular: Positive for leg swelling.        edema but significantly improved.  Musculoskeletal: Negative.   Skin: Negative.   Hematological: Negative.   Psychiatric/Behavioral: Negative.        Past Medical History  Diagnosis Date  . Allergy     rhinitis  . COPD (chronic obstructive pulmonary disease)     stage II Golds  . Hyperlipidemia   . Hypertension   . Osteoporosis   . Gout   . PMR (polymyalgia rheumatica)     cylic prednisone    History   Social History  . Marital Status: Widowed    Spouse Name: N/A    Number of Children: N/A  . Years of Education: N/A   Occupational History  . Not on file.   Social History Main Topics  . Smoking status: Former Games developer  . Smokeless tobacco: Never Used  . Alcohol Use: 0.0 oz/week  . Drug Use: No  . Sexually Active: Not on file   Other Topics Concern  . Not on file   Social History Narrative  . No narrative on file    Past Surgical History  Procedure Date  . Dilation and curettage of uterus   . Fractured arm     surgical repair  . Thyroglossal duct cyst     Family History  Problem Relation Age of Onset  . Heart disease Mother   . Diabetes Mother   . Cancer Father     lung    Allergies  Allergen Reactions  . Aspirin     REACTION: reflux  . Colchicine     REACTION: myalgia    Current Outpatient Prescriptions on File Prior to Visit  Medication Sig Dispense Refill  .  albuterol (PROAIR HFA) 108 (90 BASE) MCG/ACT inhaler Inhale 2 puffs into the lungs as needed.        Marland Kitchen allopurinol (ZYLOPRIM) 300 MG tablet TAKE 1 TABLET BY MOUTH DAILY  30 tablet  2  . BENICAR 40 MG tablet TAKE ONE TABLET BY MOUTH DAILY  30 tablet  4  . Fluticasone-Salmeterol (ADVAIR DISKUS) 250-50 MCG/DOSE AEPB Inhale 1 puff into the lungs 2 (two) times daily.  60 each  6  . furosemide (LASIX) 20 MG tablet Take 1 tablet (20 mg total) by mouth daily.  30 tablet  0  . labetalol (NORMODYNE) 200 MG tablet TAKE ONE TABLET BY MOUTH TWICE DAILY  180 tablet  2  . Multiple Vitamins-Minerals (CENTRUM SILVER PO) Take by mouth daily.        . predniSONE (DELTASONE) 2.5 MG tablet TAKE 2 TABLETS BY MOUTH EVERY OTHER DAY, ALTERNATING WITH ONE TABLET EVERY OTHER DAY  100 tablet  1  . VITAMIN D, CHOLECALCIFEROL, PO Take 1,000 Units by mouth daily.          BP 136/60  Pulse 81  Temp(Src) 97.6 F (36.4 C) (Oral)  Wt 150 lb (68.04 kg)chart Objective:  Physical Exam  Constitutional: She is oriented to person, place, and time. She appears well-developed and well-nourished.  Neck: Normal range of motion. Neck supple.  Cardiovascular: Normal rate, regular rhythm and normal heart sounds.        1+ pitting edema, but significantly improved.  Pulmonary/Chest: Breath sounds normal.  Musculoskeletal: Normal range of motion.  Neurological: She is alert and oriented to person, place, and time.  Skin: Skin is warm and dry.          Assessment & Plan:  Assessment: Bilateral edema  Plan: Continue Lasix. Followup with PCP next month. Support hose. Elevate the legs

## 2011-08-08 ENCOUNTER — Other Ambulatory Visit: Payer: Self-pay | Admitting: Internal Medicine

## 2011-08-10 ENCOUNTER — Other Ambulatory Visit: Payer: Self-pay | Admitting: Internal Medicine

## 2011-08-18 ENCOUNTER — Ambulatory Visit (INDEPENDENT_AMBULATORY_CARE_PROVIDER_SITE_OTHER): Payer: Medicare Other | Admitting: Internal Medicine

## 2011-08-18 ENCOUNTER — Encounter: Payer: Self-pay | Admitting: Internal Medicine

## 2011-08-18 DIAGNOSIS — R609 Edema, unspecified: Secondary | ICD-10-CM

## 2011-08-18 DIAGNOSIS — R600 Localized edema: Secondary | ICD-10-CM

## 2011-08-18 NOTE — Progress Notes (Signed)
Patient ID: [name], female   DOB: 18-Apr-1920, 78 y.o.   MRN: [id number] Edema---resolved IF she uses compression stockings.   Past Medical History  Diagnosis Date  . Allergy     rhinitis  . COPD (chronic obstructive pulmonary disease)     stage II Golds  . Hyperlipidemia   . Hypertension   . Osteoporosis   . Gout   . PMR (polymyalgia rheumatica)     cylic prednisone    History   Social History  . Marital Status: Widowed    Spouse Name: N/A    Number of Children: N/A  . Years of Education: N/A   Occupational History  . Not on file.   Social History Main Topics  . Smoking status: Former Games developer  . Smokeless tobacco: Never Used  . Alcohol Use: 0.0 oz/week  . Drug Use: No  . Sexually Active: Not on file   Other Topics Concern  . Not on file   Social History Narrative  . No narrative on file    Past Surgical History  Procedure Date  . Dilation and curettage of uterus   . Fractured arm     surgical repair  . Thyroglossal duct cyst     Family History  Problem Relation Age of Onset  . Heart disease Mother   . Diabetes Mother   . Cancer Father     lung    Allergies  Allergen Reactions  . Aspirin     REACTION: reflux  . Colchicine     REACTION: myalgia    Current Outpatient Prescriptions on File Prior to Visit  Medication Sig Dispense Refill  . albuterol (PROAIR HFA) 108 (90 BASE) MCG/ACT inhaler Inhale 2 puffs into the lungs as needed.        Marland Kitchen allopurinol (ZYLOPRIM) 300 MG tablet TAKE 1 TABLET BY MOUTH DAILY  30 tablet  1  . BENICAR 40 MG tablet TAKE ONE TABLET BY MOUTH DAILY  30 tablet  4  . Fluticasone-Salmeterol (ADVAIR DISKUS) 250-50 MCG/DOSE AEPB Inhale 1 puff into the lungs 2 (two) times daily.  60 each  6  . furosemide (LASIX) 20 MG tablet Take 1 tablet (20 mg total) by mouth daily.  30 tablet  0  . labetalol (NORMODYNE) 200 MG tablet TAKE ONE TABLET BY MOUTH TWICE DAILY  180 tablet  1  . Multiple Vitamins-Minerals (CENTRUM SILVER PO)  Take by mouth daily.        . predniSONE (DELTASONE) 2.5 MG tablet TAKE 2 TABLETS BY MOUTH EVERY OTHER DAY, ALTERNATING WITH ONE TABLET EVERY OTHER DAY  100 tablet  1  . VITAMIN D, CHOLECALCIFEROL, PO Take 1,000 Units by mouth daily.           patient denies chest pain, shortness of breath, orthopnea. Denies lower extremity edema, abdominal pain, change in appetite, change in bowel movements. Patient denies rashes, musculoskeletal complaints. No other specific complaints in a complete review of systems.   BP 144/84  Pulse 80  Temp(Src) 98.2 F (36.8 C) (Oral)  Wt 145 lb (65.772 kg) Trace edema at ankles

## 2011-08-18 NOTE — Assessment & Plan Note (Signed)
Discussed at length. Likely cause is venous insufficiency Compression stockings ar the key. I'll leave on lasix (she will consider stopping)

## 2011-09-01 ENCOUNTER — Ambulatory Visit (INDEPENDENT_AMBULATORY_CARE_PROVIDER_SITE_OTHER): Payer: Medicare Other | Admitting: Critical Care Medicine

## 2011-09-01 ENCOUNTER — Encounter: Payer: Self-pay | Admitting: Critical Care Medicine

## 2011-09-01 DIAGNOSIS — J449 Chronic obstructive pulmonary disease, unspecified: Secondary | ICD-10-CM

## 2011-09-01 NOTE — Patient Instructions (Signed)
No change in medications. Return in        6 months        

## 2011-09-01 NOTE — Progress Notes (Signed)
Subjective:    Patient ID: [name], female    DOB: 03/09/1920, 78 y.o.   MRN: [id number]  HPI  78 y.o.  WF Copd Dyspnea ok,  Heat an issue, not much cough ,just pn drip.  Using saline.  No real chest pain.  Min edema in feet. 09/02/2011 Doing ok except for the past and brother now living with the pt and stressful. Has visiting angels with brothers needs.  Not that dyspneic.  Notes some edema in the feet.  Past Medical History  Diagnosis Date  . Allergy     rhinitis  . COPD (chronic obstructive pulmonary disease)     stage II Golds  . Hyperlipidemia   . Hypertension   . Osteoporosis   . Gout   . PMR (polymyalgia rheumatica)     cylic prednisone     Family History  Problem Relation Age of Onset  . Heart disease Mother   . Diabetes Mother   . Cancer Father     lung     History   Social History  . Marital Status: Widowed    Spouse Name: N/A    Number of Children: N/A  . Years of Education: N/A   Occupational History  . Not on file.   Social History Main Topics  . Smoking status: Former Smoker -- 0.8 packs/day for 26 years    Types: Cigarettes    Quit date: 08/03/1997  . Smokeless tobacco: Never Used  . Alcohol Use: 0.0 oz/week  . Drug Use: No  . Sexually Active: Not on file   Other Topics Concern  . Not on file   Social History Narrative  . No narrative on file     Allergies  Allergen Reactions  . Aspirin     REACTION: reflux  . Colchicine     REACTION: myalgia     Outpatient Prescriptions Prior to Visit  Medication Sig Dispense Refill  . albuterol (PROAIR HFA) 108 (90 BASE) MCG/ACT inhaler Inhale 2 puffs into the lungs as needed.        Marland Kitchen allopurinol (ZYLOPRIM) 300 MG tablet TAKE 1 TABLET BY MOUTH DAILY  30 tablet  1  . BENICAR 40 MG tablet TAKE ONE TABLET BY MOUTH DAILY  30 tablet  4  . Fluticasone-Salmeterol (ADVAIR DISKUS) 250-50 MCG/DOSE AEPB Inhale 1 puff into the lungs 2 (two) times daily.  60 each  6  . labetalol (NORMODYNE) 200  MG tablet TAKE ONE TABLET BY MOUTH TWICE DAILY  180 tablet  1  . Multiple Vitamins-Minerals (CENTRUM SILVER PO) Take by mouth daily.        . predniSONE (DELTASONE) 2.5 MG tablet TAKE 2 TABLETS BY MOUTH EVERY OTHER DAY, ALTERNATING WITH ONE TABLET EVERY OTHER DAY  100 tablet  1  . VITAMIN D, CHOLECALCIFEROL, PO Take 1,000 Units by mouth daily.        . furosemide (LASIX) 20 MG tablet Take 1 tablet (20 mg total) by mouth daily.  30 tablet  0     Review of Systems  Constitutional:   No  weight loss, night sweats,  Fevers, chills, fatigue, lassitude. HEENT:   No headaches,  Difficulty swallowing,  Tooth/dental problems,  Sore throat,                No sneezing, itching, ear ache, nasal congestion, post nasal drip,   CV:  No chest pain,  Orthopnea, PND, notes mild swelling in lower extremities, no anasarca, dizziness, palpitations  GI  No  heartburn, indigestion, abdominal pain, nausea, vomiting, diarrhea, change in bowel habits, loss of appetite  Resp: Notes  shortness of breath with exertion not  at rest.  No excess mucus, no productive cough,  No non-productive cough,  No coughing up of blood.  No change in color of mucus.  No wheezing.  No chest wall deformity  Skin: no rash or lesions.  GU: no dysuria, change in color of urine, no urgency or frequency.  No flank pain.  MS:  No joint pain or swelling.  No decreased range of motion.  No back pain.  Psych:  No change in mood or affect. No depression or anxiety.  No memory loss.     Objective:   Physical Exam  Filed Vitals:   09/01/11 1549  BP: 140/76  Pulse: 93  Temp: 98.3 F (36.8 C)  TempSrc: Oral  Height: 4\' 11"  (1.499 m)  Weight: 144 lb 9.6 oz (65.59 kg)  SpO2: 95%    Gen: Pleasant, well-nourished, in no distress,  normal affect  ENT: No lesions,  mouth clear,  oropharynx clear, no postnasal drip  Neck: No JVD, no TMG, no carotid bruits  Lungs: No use of accessory muscles, no dullness to percussion, distant  BS  Cardiovascular: RRR, heart sounds normal, no murmur or gallops, no peripheral edema  Abdomen: soft and NT, no HSM,  BS normal  Musculoskeletal: No deformities, no cyanosis or clubbing  Neuro: alert, non focal  Skin: Warm, no lesions or rashes       PFT Conversion 12/20/2009  FVC 1.71  FVC PREDICT   FVC  % Predicted 102.4  FEV1 1.21  FEV1 PREDICT 1.20  FEV % Predicted 100.3  FEV1/FVC 70.8  FEV1/FVC PRE   FEV1/FVC%EXP 97.60  FeF 25-75   FeF 25-75 % Predicted   FEF % EXPEC   PEF % EXPECT 3.29    Assessment & Plan:   COPD Moderate Copd stable at this time Plan No change in inhaled or maintenance medications. Return in   6months     Updated Medication List Outpatient Encounter Prescriptions as of 09/01/2011  Medication Sig Dispense Refill  . albuterol (PROAIR HFA) 108 (90 BASE) MCG/ACT inhaler Inhale 2 puffs into the lungs as needed.        Marland Kitchen allopurinol (ZYLOPRIM) 300 MG tablet TAKE 1 TABLET BY MOUTH DAILY  30 tablet  1  . BENICAR 40 MG tablet TAKE ONE TABLET BY MOUTH DAILY  30 tablet  4  . Fluticasone-Salmeterol (ADVAIR DISKUS) 250-50 MCG/DOSE AEPB Inhale 1 puff into the lungs 2 (two) times daily.  60 each  6  . furosemide (LASIX) 20 MG tablet Take 20 mg by mouth every other day as needed.      . labetalol (NORMODYNE) 200 MG tablet TAKE ONE TABLET BY MOUTH TWICE DAILY  180 tablet  1  . Multiple Vitamins-Minerals (CENTRUM SILVER PO) Take by mouth daily.        . predniSONE (DELTASONE) 2.5 MG tablet TAKE 2 TABLETS BY MOUTH EVERY OTHER DAY, ALTERNATING WITH ONE TABLET EVERY OTHER DAY  100 tablet  1  . VITAMIN D, CHOLECALCIFEROL, PO Take 1,000 Units by mouth daily.        Marland Kitchen DISCONTD: furosemide (LASIX) 20 MG tablet Take 1 tablet (20 mg total) by mouth daily.  30 tablet  0

## 2011-09-02 NOTE — Assessment & Plan Note (Signed)
Moderate Copd stable at this time Plan No change in inhaled or maintenance medications. Return in   6months

## 2011-09-18 ENCOUNTER — Encounter: Payer: Self-pay | Admitting: Internal Medicine

## 2011-09-18 ENCOUNTER — Ambulatory Visit (INDEPENDENT_AMBULATORY_CARE_PROVIDER_SITE_OTHER): Payer: Medicare Other | Admitting: Internal Medicine

## 2011-09-18 DIAGNOSIS — R600 Localized edema: Secondary | ICD-10-CM

## 2011-09-18 DIAGNOSIS — M353 Polymyalgia rheumatica: Secondary | ICD-10-CM

## 2011-09-18 DIAGNOSIS — R609 Edema, unspecified: Secondary | ICD-10-CM

## 2011-09-18 DIAGNOSIS — E785 Hyperlipidemia, unspecified: Secondary | ICD-10-CM

## 2011-09-18 DIAGNOSIS — I10 Essential (primary) hypertension: Secondary | ICD-10-CM

## 2011-09-18 NOTE — Assessment & Plan Note (Signed)
Venous insufficiency---will use compression stockings

## 2011-09-18 NOTE — Assessment & Plan Note (Signed)
Continue low dose steroids

## 2011-09-18 NOTE — Progress Notes (Signed)
Patient ID: [name], female   DOB: 1919-11-16, 78 y.o.   MRN: [id number] Lower extremity edema---stable even off of furosemide. Presumptive diagnosis of venous insufficiency  HTN---no home BPS  She describes a few episodes of vertigo/dizziness when she stopped lasix---now resolved  Gout--no recurrence  Past Medical History  Diagnosis Date  . Allergy     rhinitis  . COPD (chronic obstructive pulmonary disease)     stage II Golds  . Hyperlipidemia   . Hypertension   . Osteoporosis   . Gout   . PMR (polymyalgia rheumatica)     cylic prednisone    History   Social History  . Marital Status: Widowed    Spouse Name: N/A    Number of Children: N/A  . Years of Education: N/A   Occupational History  . Not on file.   Social History Main Topics  . Smoking status: Former Smoker -- 0.8 packs/day for 26 years    Types: Cigarettes    Quit date: 08/03/1997  . Smokeless tobacco: Never Used  . Alcohol Use: 0.0 oz/week  . Drug Use: No  . Sexually Active: Not on file   Other Topics Concern  . Not on file   Social History Narrative  . No narrative on file    Past Surgical History  Procedure Date  . Dilation and curettage of uterus   . Fractured arm     surgical repair  . Thyroglossal duct cyst     Family History  Problem Relation Age of Onset  . Heart disease Mother   . Diabetes Mother   . Cancer Father     lung    Allergies  Allergen Reactions  . Aspirin     REACTION: reflux  . Colchicine     REACTION: myalgia    Current Outpatient Prescriptions on File Prior to Visit  Medication Sig Dispense Refill  . albuterol (PROAIR HFA) 108 (90 BASE) MCG/ACT inhaler Inhale 2 puffs into the lungs as needed.        Marland Kitchen allopurinol (ZYLOPRIM) 300 MG tablet TAKE 1 TABLET BY MOUTH DAILY  30 tablet  1  . BENICAR 40 MG tablet TAKE ONE TABLET BY MOUTH DAILY  30 tablet  4  . Fluticasone-Salmeterol (ADVAIR DISKUS) 250-50 MCG/DOSE AEPB Inhale 1 puff into the lungs 2 (two)  times daily.  60 each  6  . labetalol (NORMODYNE) 200 MG tablet TAKE ONE TABLET BY MOUTH TWICE DAILY  180 tablet  1  . Multiple Vitamins-Minerals (CENTRUM SILVER PO) Take by mouth daily.        . predniSONE (DELTASONE) 2.5 MG tablet TAKE 2 TABLETS BY MOUTH EVERY OTHER DAY, ALTERNATING WITH ONE TABLET EVERY OTHER DAY  100 tablet  1  . VITAMIN D, CHOLECALCIFEROL, PO Take 1,000 Units by mouth daily.        . furosemide (LASIX) 20 MG tablet Take 20 mg by mouth every other day as needed.         patient denies chest pain, shortness of breath, orthopnea. Denies lower extremity edema, abdominal pain, change in appetite, change in bowel movements. Patient denies rashes, musculoskeletal complaints. No other specific complaints in a complete review of systems.   BP 140/70  Temp(Src) 98.4 F (36.9 C) (Oral)  Wt 142 lb (64.411 kg)  Well-developed well-nourished female in no acute distress. HEENT exam atraumatic, normocephalic, extraocular muscles are intact. Neck is supple. No jugular venous distention no thyromegaly. Chest clear to auscultation without increased work of breathing. Cardiac  exam S1 and S2 are regular. Abdominal exam active bowel sounds, soft, nontender. Extremities1+ edema

## 2011-09-18 NOTE — Assessment & Plan Note (Signed)
Fair control Note relatively high dose of labetolol Will stay on same meds given that her bp is borderline controlled anyway

## 2011-09-18 NOTE — Assessment & Plan Note (Signed)
Not on any meds---no reason to treat

## 2011-10-21 ENCOUNTER — Other Ambulatory Visit: Payer: Self-pay | Admitting: Internal Medicine

## 2011-11-17 ENCOUNTER — Encounter: Payer: Self-pay | Admitting: Internal Medicine

## 2011-11-17 ENCOUNTER — Ambulatory Visit (INDEPENDENT_AMBULATORY_CARE_PROVIDER_SITE_OTHER): Payer: Medicare Other | Admitting: Internal Medicine

## 2011-11-17 VITALS — BP 138/70 | HR 108 | Temp 98.2°F | Ht 59.0 in | Wt 140.0 lb

## 2011-11-17 DIAGNOSIS — R609 Edema, unspecified: Secondary | ICD-10-CM

## 2011-11-17 DIAGNOSIS — R600 Localized edema: Secondary | ICD-10-CM

## 2011-11-17 DIAGNOSIS — I10 Essential (primary) hypertension: Secondary | ICD-10-CM

## 2011-11-17 MED ORDER — FUROSEMIDE 20 MG PO TABS: 20.0000 mg | ORAL_TABLET | Freq: Every day | ORAL | Status: DC

## 2011-11-17 NOTE — Assessment & Plan Note (Signed)
BP adequate control

## 2011-11-17 NOTE — Progress Notes (Signed)
  Subjective:    Patient ID: [name], female    DOB: 1919/08/11, 78 y.o.   MRN: [id number]  HPI  She is concerned with RLE edema- she has had this for > one year and she understands that the treatment is leg elevation and compression stockings. The compression stockings are effective but very difficult to get on.  An episode of vertigo with URI---resolving  Past Medical History  Diagnosis Date  . Allergy     rhinitis  . COPD (chronic obstructive pulmonary disease)     stage II Golds  . Hyperlipidemia   . Hypertension   . Osteoporosis   . Gout   . PMR (polymyalgia rheumatica)     cylic prednisone   Past Surgical History  Procedure Date  . Dilation and curettage of uterus   . Fractured arm     surgical repair  . Thyroglossal duct cyst     reports that she quit smoking about 14 years ago. Her smoking use included Cigarettes. She has a 20.8 pack-year smoking history. She has never used smokeless tobacco. She reports that she drinks alcohol. She reports that she does not use illicit drugs. family history includes Cancer in her father; Diabetes in her mother; and Heart disease in her mother. Allergies  Allergen Reactions  . Aspirin     REACTION: reflux  . Colchicine     REACTION: myalgia     Review of Systems  patient denies chest pain, shortness of breath, orthopnea. Denies lower extremity edema, abdominal pain, change in appetite, change in bowel movements. Patient denies rashes, musculoskeletal complaints. No other specific complaints in a complete review of systems.      Objective:   Physical Exam  Well-developed well-nourished female in no acute distress. HEENT exam atraumatic, normocephalic, extraocular muscles are intact. Neck is supple. No jugular venous distention no thyromegaly. Chest clear to auscultation without increased work of breathing. RLE with 1 + edema

## 2011-11-17 NOTE — Assessment & Plan Note (Signed)
i am convinced that this is related to venous insufficiency Compression stockings  will try furosemide despite hx of gout (currently on allopurinol) She understands risks.

## 2011-11-23 ENCOUNTER — Other Ambulatory Visit: Payer: Self-pay | Admitting: Internal Medicine

## 2011-11-23 ENCOUNTER — Other Ambulatory Visit (INDEPENDENT_AMBULATORY_CARE_PROVIDER_SITE_OTHER): Payer: Medicare Other

## 2011-11-23 DIAGNOSIS — R600 Localized edema: Secondary | ICD-10-CM

## 2011-11-23 DIAGNOSIS — R609 Edema, unspecified: Secondary | ICD-10-CM

## 2011-11-23 LAB — BASIC METABOLIC PANEL
Chloride: 107 mEq/L (ref 96–112)
GFR: 58.1 mL/min — ABNORMAL LOW (ref >60.00)
Potassium: 4 mEq/L (ref 3.5–5.1)
Sodium: 141 mEq/L (ref 135–145)

## 2011-11-24 ENCOUNTER — Telehealth: Payer: Self-pay | Admitting: Internal Medicine

## 2011-11-24 NOTE — Telephone Encounter (Signed)
Lab work from Dr. Lynelle Doctor office yesterday is normal.

## 2011-11-24 NOTE — Telephone Encounter (Signed)
Pt called and said that swelling in foot and leg has gone down in morning since taking the lasix, but swelling returns in the afternoon.  The Lasix has helped remove a lot of the fluid. Pt also would like to know lab results as soon as they become avail.

## 2011-11-25 NOTE — Telephone Encounter (Signed)
Pt aware.

## 2011-12-03 ENCOUNTER — Encounter: Payer: Self-pay | Admitting: Family

## 2011-12-03 ENCOUNTER — Ambulatory Visit (INDEPENDENT_AMBULATORY_CARE_PROVIDER_SITE_OTHER): Payer: Medicare Other | Admitting: Family

## 2011-12-03 VITALS — BP 144/72 | Temp 97.4°F | Wt 133.0 lb

## 2011-12-03 DIAGNOSIS — R1013 Epigastric pain: Secondary | ICD-10-CM

## 2011-12-03 DIAGNOSIS — K219 Gastro-esophageal reflux disease without esophagitis: Secondary | ICD-10-CM

## 2011-12-03 LAB — HEPATIC FUNCTION PANEL
ALT: 18 U/L (ref 0–35)
AST: 19 U/L (ref 0–37)
Alkaline Phosphatase: 57 U/L (ref 39–117)
Bilirubin, Direct: 0 mg/dL (ref 0.0–0.3)
Total Bilirubin: 0.8 mg/dL (ref 0.3–1.2)

## 2011-12-03 LAB — H. PYLORI ANTIBODY, IGG: H Pylori IgG: POSITIVE

## 2011-12-03 LAB — CBC WITH DIFFERENTIAL/PLATELET
Basophils Absolute: 0 10*3/uL (ref 0.0–0.1)
Eosinophils Absolute: 0.1 10*3/uL (ref 0.0–0.7)
Eosinophils Relative: 0.9 % (ref 0.0–5.0)
HCT: 39.7 % (ref 36.0–46.0)
Lymphs Abs: 0.8 10*3/uL (ref 0.7–4.0)
MCHC: 33.5 g/dL (ref 30.0–36.0)
MCV: 94.2 fl (ref 78.0–100.0)
Monocytes Absolute: 0.3 10*3/uL (ref 0.1–1.0)
Neutrophils Relative %: 78.9 % — ABNORMAL HIGH (ref 43.0–77.0)
Platelets: 154 10*3/uL (ref 150.0–400.0)
RDW: 13.7 % (ref 11.5–14.6)

## 2011-12-03 MED ORDER — OMEPRAZOLE 40 MG PO CPDR: 40.0000 mg | DELAYED_RELEASE_CAPSULE | Freq: Every day | ORAL | Status: DC

## 2011-12-03 NOTE — Patient Instructions (Signed)
Diet for GERD or PUD Nutrition therapy can help ease the discomfort of gastroesophageal reflux disease (GERD) and peptic ulcer disease (PUD).  HOME CARE INSTRUCTIONS   Eat your meals slowly, in a relaxed setting.   Eat 5 to 6 small meals per day.   If a food causes distress, stop eating it for a period of time.  FOODS TO AVOID  Coffee, regular or decaffeinated.   Cola beverages, regular or low calorie.   Tea, regular or decaffeinated.   Pepper.   Cocoa.   High fat foods, including meats.   Butter, margarine, hydrogenated oil (trans fats).   Peppermint or spearmint (if you have GERD).   Fruits and vegetables if not tolerated.   Alcohol.   Nicotine (smoking or chewing). This is one of the most potent stimulants to acid production in the gastrointestinal tract.   Any food that seems to aggravate your condition.  If you have questions regarding your diet, ask your caregiver or a registered dietitian. TIPS  Lying flat may make symptoms worse. Keep the head of your bed raised 6 to 9 inches (15 to 23 cm) by using a foam wedge or blocks under the legs of the bed.   Do not lay down until 3 hours after eating a meal.   Daily physical activity may help reduce symptoms.  MAKE SURE YOU:   Understand these instructions.   Will watch your condition.   Will get help right away if you are not doing well or get worse.  Document Released: 07/20/2005 Document Revised: 07/09/2011 Document Reviewed: 06/05/2011 [employer] Patient Information 2012 [employer]. 

## 2011-12-03 NOTE — Progress Notes (Signed)
Subjective:    Patient ID: [name], female    DOB: May 20, 1920, 78 y.o.   MRN: [id number]  Gastrophageal Reflux She complains of abdominal pain, belching, heartburn and nausea. This is a new problem. The current episode started in the past 7 days. The problem occurs frequently. The problem has been unchanged. The heartburn duration is more than one hour. The heartburn is of moderate intensity. The heartburn does not wake her from sleep. The heartburn limits her activity. The heartburn changes with position. The symptoms are aggravated by certain foods (Chocolate). Risk factors include ETOH use and caffeine use. The treatment provided moderate relief.      Review of Systems  Constitutional: Negative.   HENT: Negative.   Respiratory: Negative.   Cardiovascular: Negative.   Gastrointestinal: Positive for heartburn, nausea, vomiting and abdominal pain.  Musculoskeletal: Negative.   Skin: Negative.   Neurological: Negative.   Hematological: Negative.   Psychiatric/Behavioral: Negative.    Past Medical History  Diagnosis Date  . Allergy     rhinitis  . COPD (chronic obstructive pulmonary disease)     stage II Golds  . Hyperlipidemia   . Hypertension   . Osteoporosis   . Gout   . PMR (polymyalgia rheumatica)     cylic prednisone    History   Social History  . Marital Status: Widowed    Spouse Name: N/A    Number of Children: N/A  . Years of Education: N/A   Occupational History  . Not on file.   Social History Main Topics  . Smoking status: Former Smoker -- 0.8 packs/day for 26 years    Types: Cigarettes    Quit date: 08/03/1997  . Smokeless tobacco: Never Used  . Alcohol Use: 0.0 oz/week  . Drug Use: No  . Sexually Active: Not on file   Other Topics Concern  . Not on file   Social History Narrative  . No narrative on file    Past Surgical History  Procedure Date  . Dilation and curettage of uterus   . Fractured arm     surgical repair  .  Thyroglossal duct cyst     Family History  Problem Relation Age of Onset  . Heart disease Mother   . Diabetes Mother   . Cancer Father     lung    Allergies  Allergen Reactions  . Aspirin     REACTION: reflux  . Colchicine     REACTION: myalgia    Current Outpatient Prescriptions on File Prior to Visit  Medication Sig Dispense Refill  . albuterol (PROAIR HFA) 108 (90 BASE) MCG/ACT inhaler Inhale 2 puffs into the lungs as needed.        Marland Kitchen allopurinol (ZYLOPRIM) 300 MG tablet TAKE 1 TABLET BY MOUTH DAILY  30 tablet  5  . BENICAR 40 MG tablet TAKE ONE TABLET BY MOUTH DAILY  30 tablet  4  . Fluticasone-Salmeterol (ADVAIR DISKUS) 250-50 MCG/DOSE AEPB Inhale 1 puff into the lungs 2 (two) times daily.  60 each  6  . labetalol (NORMODYNE) 200 MG tablet TAKE ONE TABLET BY MOUTH TWICE DAILY  180 tablet  1  . Multiple Vitamins-Minerals (CENTRUM SILVER PO) Take by mouth daily.        . predniSONE (DELTASONE) 2.5 MG tablet       . VITAMIN D, CHOLECALCIFEROL, PO Take 1,000 Units by mouth daily.        . furosemide (LASIX) 20 MG tablet Take 1 tablet (20 mg  total) by mouth daily.  30 tablet  11  . omeprazole (PRILOSEC) 40 MG capsule Take 1 capsule (40 mg total) by mouth daily.  30 capsule  3    BP 144/72  Temp(Src) 97.4 F (36.3 C) (Oral)  Wt 133 lb (60.328 kg)chart    Objective:   Physical Exam  Constitutional: She is oriented to person, place, and time. She appears well-developed and well-nourished.  Eyes: Conjunctivae are normal. Pupils are equal, round, and reactive to light.  Neck: Normal range of motion. Neck supple.  Cardiovascular: Normal rate, regular rhythm and normal heart sounds.   Pulmonary/Chest: Breath sounds normal.  Abdominal: Soft. Bowel sounds are normal.  Musculoskeletal: Normal range of motion.  Neurological: She is alert and oriented to person, place, and time.  Skin: Skin is warm and dry.  Psychiatric: She has a normal mood and affect.            Assessment & Plan:  Assessment:GERD, Nausea and Vomiting  Plan: Start Omeprazole 40mg  QD. Labs sent to include LFT, CBC, H. Pylori. GERD appropriate diet. Recheck in 2 weeks.

## 2011-12-04 ENCOUNTER — Telehealth: Payer: Self-pay

## 2011-12-04 ENCOUNTER — Other Ambulatory Visit: Payer: Self-pay | Admitting: Family

## 2011-12-04 MED ORDER — AMOXICILLIN 500 MG PO CAPS: 500.0000 mg | ORAL_CAPSULE | Freq: Two times a day (BID) | ORAL | Status: AC

## 2011-12-04 MED ORDER — CLARITHROMYCIN 500 MG PO TABS: 500.0000 mg | ORAL_TABLET | Freq: Two times a day (BID) | ORAL | Status: AC

## 2011-12-04 NOTE — Telephone Encounter (Signed)
Pt aware and Rx sent to pharmacy. Info on hpylori sent to pt

## 2011-12-04 NOTE — Telephone Encounter (Signed)
Message copied by Beverely Low on Fri Dec 04, 2011  9:36 AM ------      Message from: Adline Mango B      Created: Fri Dec 04, 2011  8:48 AM       Positive for H pylori. Biaxin 500mg  BID X 10 days and Amoxil 500mg  BID X 10 days. Continue Omeprazole.

## 2011-12-17 ENCOUNTER — Ambulatory Visit: Payer: Medicare Other | Admitting: Family

## 2011-12-18 ENCOUNTER — Ambulatory Visit: Payer: Medicare Other | Admitting: Family

## 2011-12-21 ENCOUNTER — Encounter: Payer: Self-pay | Admitting: Family

## 2011-12-21 ENCOUNTER — Ambulatory Visit (INDEPENDENT_AMBULATORY_CARE_PROVIDER_SITE_OTHER): Payer: Medicare Other | Admitting: Family

## 2011-12-21 VITALS — BP 142/80 | Temp 98.1°F | Wt 138.0 lb

## 2011-12-21 DIAGNOSIS — A048 Other specified bacterial intestinal infections: Secondary | ICD-10-CM

## 2011-12-21 NOTE — Progress Notes (Signed)
  Subjective:    Patient ID: [name], female    DOB: 10/03/1919, 78 y.o.   MRN: [id number]  HPI 78 year old WF, Patient of Dr. Cato Mulligan is in today for a recheck of H. Pylori. After antibiotics, she feels much better. She completed meds without difficulty.    Review of Systems  Constitutional: Negative.   Respiratory: Negative.   Cardiovascular: Negative.  Negative for palpitations.  Gastrointestinal: Negative.  Negative for nausea, abdominal pain, diarrhea and blood in stool.  Genitourinary: Negative.   Musculoskeletal: Negative.   Skin: Negative.   Neurological: Negative.   Hematological: Negative.   Psychiatric/Behavioral: Negative.   All other systems reviewed and are negative.       Objective:   Physical Exam  Constitutional: She is oriented to person, place, and time. She appears well-developed and well-nourished.  HENT:  Right Ear: External ear normal.  Left Ear: External ear normal.  Nose: Nose normal.  Mouth/Throat: Oropharynx is clear and moist.  Eyes: Conjunctivae are normal. Pupils are equal, round, and reactive to light.  Neck: Normal range of motion. Neck supple.  Cardiovascular: Normal rate, regular rhythm and normal heart sounds.   Pulmonary/Chest: Effort normal and breath sounds normal.  Abdominal: Soft. Bowel sounds are normal.  Musculoskeletal: Normal range of motion.  Neurological: She is alert and oriented to person, place, and time.  Skin: Skin is warm and dry.  Psychiatric: She has a normal mood and affect.          Assessment & Plan:  Assessment: Recheck H. Pylori, GERD  Plan: Continue Omeprazole x 6 weeks. Recheck with PCP as scheduled and sooner as needed.

## 2012-01-15 ENCOUNTER — Other Ambulatory Visit: Payer: Self-pay | Admitting: Internal Medicine

## 2012-02-11 ENCOUNTER — Other Ambulatory Visit: Payer: Self-pay | Admitting: Internal Medicine

## 2012-02-12 ENCOUNTER — Encounter: Payer: Self-pay | Admitting: Critical Care Medicine

## 2012-02-12 ENCOUNTER — Ambulatory Visit (INDEPENDENT_AMBULATORY_CARE_PROVIDER_SITE_OTHER): Payer: Medicare Other | Admitting: Critical Care Medicine

## 2012-02-12 VITALS — BP 146/82 | HR 103 | Temp 97.5°F | Ht 59.0 in | Wt 137.8 lb

## 2012-02-12 DIAGNOSIS — J4489 Other specified chronic obstructive pulmonary disease: Secondary | ICD-10-CM

## 2012-02-12 DIAGNOSIS — J449 Chronic obstructive pulmonary disease, unspecified: Secondary | ICD-10-CM

## 2012-02-12 MED ORDER — PREDNISONE 10 MG PO TABS: ORAL_TABLET | ORAL | Status: DC

## 2012-02-12 MED ORDER — PREDNISONE 2.5 MG PO TABS: ORAL_TABLET | ORAL | Status: DC

## 2012-02-12 MED ORDER — AZITHROMYCIN 250 MG PO TABS: 250.0000 mg | ORAL_TABLET | Freq: Every day | ORAL | Status: AC

## 2012-02-12 MED ORDER — FLUTICASONE-SALMETEROL 250-50 MCG/DOSE IN AEPB: 1.0000 | INHALATION_SPRAY | Freq: Two times a day (BID) | RESPIRATORY_TRACT | Status: DC

## 2012-02-12 NOTE — Patient Instructions (Addendum)
Hold 2.5mg  prednisone until 10mg  prednisone is finished Prednisone 10mg  Take 4 tablets daily for 5 days then stop Azithromycin 250mg  Take two once then one daily until gone Stay on advair Return 4 months

## 2012-02-12 NOTE — Progress Notes (Signed)
Subjective:    Patient ID: [name], female    DOB: [birth year], 78 y.o.   MRN: [id number]  HPI  78 y.o.  WF Copd  02/12/2012 Pt started coughing thick mucus green since 02/04/12.   Very exhausting with the brother. Does all the laundry and cooking for the brother.   Pt notes more dyspnea.    Past Medical History  Diagnosis Date  . Allergy     rhinitis  . COPD (chronic obstructive pulmonary disease)     stage II Golds  . Hyperlipidemia   . Hypertension   . Osteoporosis   . Gout   . PMR (polymyalgia rheumatica)     cylic prednisone     Family History  Problem Relation Age of Onset  . Heart disease Mother   . Diabetes Mother   . Cancer Father     lung     History   Social History  . Marital Status: Widowed    Spouse Name: N/A    Number of Children: N/A  . Years of Education: N/A   Occupational History  . Not on file.   Social History Main Topics  . Smoking status: Former Smoker -- 0.8 packs/day for 26 years    Types: Cigarettes    Quit date: 08/03/1997  . Smokeless tobacco: Never Used  . Alcohol Use: 0.0 oz/week  . Drug Use: No  . Sexually Active: Not on file   Other Topics Concern  . Not on file   Social History Narrative  . No narrative on file     Allergies  Allergen Reactions  . Aspirin     REACTION: reflux  . Colchicine     REACTION: myalgia     Outpatient Prescriptions Prior to Visit  Medication Sig Dispense Refill  . albuterol (PROAIR HFA) 108 (90 BASE) MCG/ACT inhaler Inhale 2 puffs into the lungs as needed.        Marland Kitchen allopurinol (ZYLOPRIM) 300 MG tablet TAKE 1 TABLET BY MOUTH DAILY  30 tablet  5  . BENICAR 40 MG tablet TAKE ONE TABLET BY MOUTH DAILY  30 tablet  3  . labetalol (NORMODYNE) 200 MG tablet TAKE ONE TABLET BY MOUTH TWICE DAILY  180 tablet  1  . Multiple Vitamins-Minerals (CENTRUM SILVER PO) Take by mouth daily.        Marland Kitchen omeprazole (PRILOSEC) 40 MG capsule Take 1 capsule (40 mg total) by mouth daily.  30 capsule  3  .  VITAMIN D, CHOLECALCIFEROL, PO Take 1,000 Units by mouth daily.        . Fluticasone-Salmeterol (ADVAIR DISKUS) 250-50 MCG/DOSE AEPB Inhale 1 puff into the lungs 2 (two) times daily.  60 each  6  . furosemide (LASIX) 20 MG tablet Take 1 tablet (20 mg total) by mouth daily.  30 tablet  11  . predniSONE (DELTASONE) 2.5 MG tablet TAKE 2 TABLETS BY MOUTH EVERY OTHER DAY, ALTERNATING WITH ONE TABLET EVERY OTHER DAY  100 tablet  0     Review of Systems  Constitutional:   No  weight loss, night sweats,  Fevers, chills, fatigue, lassitude. HEENT:   No headaches,  Difficulty swallowing,  Tooth/dental problems,  Sore throat,                No sneezing, itching, ear ache, nasal congestion, post nasal drip,   CV:  No chest pain,  Orthopnea, PND, notes mild swelling in lower extremities, no anasarca, dizziness, palpitations  GI  No heartburn, indigestion, abdominal  pain, nausea, vomiting, diarrhea, change in bowel habits, loss of appetite  Resp: Notes  shortness of breath with exertion not  at rest.  No excess mucus, no productive cough,  No non-productive cough,  No coughing up of blood.  No change in color of mucus.  No wheezing.  No chest wall deformity  Skin: no rash or lesions.  GU: no dysuria, change in color of urine, no urgency or frequency.  No flank pain.  MS:  No joint pain or swelling.  No decreased range of motion.  No back pain.  Psych:  No change in mood or affect. No depression or anxiety.  No memory loss.     Objective:   Physical Exam  Filed Vitals:   02/12/12 1527  BP: 146/82  Pulse: 103  Temp: 97.5 F (36.4 C)  TempSrc: Oral  Height: 4\' 11"  (1.499 m)  Weight: 137 lb 12.8 oz (62.506 kg)  SpO2: 94%    Gen: Pleasant, well-nourished, in no distress,  normal affect  ENT: No lesions,  mouth clear,  oropharynx clear, no postnasal drip  Neck: No JVD, no TMG, no carotid bruits  Lungs: No use of accessory muscles, no dullness to percussion, distant BS  Cardiovascular:  RRR, heart sounds normal, no murmur or gallops, no peripheral edema  Abdomen: soft and NT, no HSM,  BS normal  Musculoskeletal: No deformities, no cyanosis or clubbing  Neuro: alert, non focal  Skin: Warm, no lesions or rashes       PFT Conversion 12/20/2009  FVC 1.71  FVC PREDICT   FVC  % Predicted 102.4  FEV1 1.21  FEV1 PREDICT 1.20  FEV % Predicted 100.3  FEV1/FVC 70.8  FEV1/FVC PRE   FEV1/FVC%EXP 97.60  FeF 25-75   FeF 25-75 % Predicted   FEF % EXPEC   PEF % EXPECT 3.29    Assessment & Plan:   COPD Gold B Copd with exacerbation Plan Hold 2.5mg  prednisone until 10mg  prednisone is finished Prednisone 10mg  Take 4 tablets daily for 5 days then stop Azithromycin 250mg  Take two once then one daily until gone Stay on advair Return 4 months     Updated Medication List Outpatient Encounter Prescriptions as of 02/12/2012  Medication Sig Dispense Refill  . albuterol (PROAIR HFA) 108 (90 BASE) MCG/ACT inhaler Inhale 2 puffs into the lungs as needed.        Marland Kitchen allopurinol (ZYLOPRIM) 300 MG tablet TAKE 1 TABLET BY MOUTH DAILY  30 tablet  5  . BENICAR 40 MG tablet TAKE ONE TABLET BY MOUTH DAILY  30 tablet  3  . Fluticasone-Salmeterol (ADVAIR DISKUS) 250-50 MCG/DOSE AEPB Inhale 1 puff into the lungs 2 (two) times daily.  60 each  11  . furosemide (LASIX) 20 MG tablet Take 20 mg by mouth daily as needed.      . labetalol (NORMODYNE) 200 MG tablet TAKE ONE TABLET BY MOUTH TWICE DAILY  180 tablet  1  . Multiple Vitamins-Minerals (CENTRUM SILVER PO) Take by mouth daily.        Marland Kitchen omeprazole (PRILOSEC) 40 MG capsule Take 1 capsule (40 mg total) by mouth daily.  30 capsule  3  . predniSONE (DELTASONE) 2.5 MG tablet HOLD until off 10mg  prednisone then resume  100 tablet  0  . VITAMIN D, CHOLECALCIFEROL, PO Take 1,000 Units by mouth daily.        Marland Kitchen DISCONTD: Fluticasone-Salmeterol (ADVAIR DISKUS) 250-50 MCG/DOSE AEPB Inhale 1 puff into the lungs 2 (two) times daily.  60 each  6  .  DISCONTD: furosemide (LASIX) 20 MG tablet Take 1 tablet (20 mg total) by mouth daily.  30 tablet  11  . DISCONTD: predniSONE (DELTASONE) 2.5 MG tablet TAKE 2 TABLETS BY MOUTH EVERY OTHER DAY, ALTERNATING WITH ONE TABLET EVERY OTHER DAY  100 tablet  0  . azithromycin (ZITHROMAX) 250 MG tablet Take 1 tablet (250 mg total) by mouth daily. Take two once then one daily until gone  6 each  0  . predniSONE (DELTASONE) 10 MG tablet Take 4 tablets daily for 5 days then stop  20 tablet  0

## 2012-02-14 NOTE — Assessment & Plan Note (Signed)
Gold B Copd with exacerbation Plan Hold 2.5mg  prednisone until 10mg  prednisone is finished Prednisone 10mg  Take 4 tablets daily for 5 days then stop Azithromycin 250mg  Take two once then one daily until gone Stay on advair Return 4 months

## 2012-02-19 ENCOUNTER — Other Ambulatory Visit: Payer: Self-pay | Admitting: Internal Medicine

## 2012-03-18 ENCOUNTER — Ambulatory Visit (INDEPENDENT_AMBULATORY_CARE_PROVIDER_SITE_OTHER): Payer: Medicare Other | Admitting: Internal Medicine

## 2012-03-18 ENCOUNTER — Encounter: Payer: Self-pay | Admitting: Internal Medicine

## 2012-03-18 ENCOUNTER — Ambulatory Visit: Payer: Medicare Other | Admitting: Internal Medicine

## 2012-03-18 VITALS — BP 140/76 | HR 80 | Temp 97.9°F | Wt 137.0 lb

## 2012-03-18 DIAGNOSIS — E785 Hyperlipidemia, unspecified: Secondary | ICD-10-CM

## 2012-03-18 DIAGNOSIS — M353 Polymyalgia rheumatica: Secondary | ICD-10-CM

## 2012-03-18 DIAGNOSIS — I10 Essential (primary) hypertension: Secondary | ICD-10-CM

## 2012-03-18 DIAGNOSIS — J449 Chronic obstructive pulmonary disease, unspecified: Secondary | ICD-10-CM

## 2012-03-18 MED ORDER — CARVEDILOL 12.5 MG PO TABS: 12.5000 mg | ORAL_TABLET | Freq: Two times a day (BID) | ORAL | Status: DC

## 2012-03-18 NOTE — Assessment & Plan Note (Signed)
No need for follow up

## 2012-03-18 NOTE — Progress Notes (Signed)
Patient ID: [name], female   DOB: January 04, 1920, 78 y.o.   MRN: [id number] COPD-- doing well on meds currently- see dr. Lynelle Doctor note from July  htn-- tolerating meds. No home bps  Past Medical History  Diagnosis Date  . Allergy     rhinitis  . COPD (chronic obstructive pulmonary disease)     stage II Golds  . Hyperlipidemia   . Hypertension   . Osteoporosis   . Gout   . PMR (polymyalgia rheumatica)     cylic prednisone    History   Social History  . Marital Status: Widowed    Spouse Name: N/A    Number of Children: N/A  . Years of Education: N/A   Occupational History  . Not on file.   Social History Main Topics  . Smoking status: Former Smoker -- 0.8 packs/day for 26 years    Types: Cigarettes    Quit date: 08/03/1997  . Smokeless tobacco: Never Used  . Alcohol Use: 0.0 oz/week  . Drug Use: No  . Sexually Active: Not on file   Other Topics Concern  . Not on file   Social History Narrative  . No narrative on file    Past Surgical History  Procedure Date  . Dilation and curettage of uterus   . Fractured arm     surgical repair  . Thyroglossal duct cyst     Family History  Problem Relation Age of Onset  . Heart disease Mother   . Diabetes Mother   . Cancer Father     lung    Allergies  Allergen Reactions  . Aspirin     REACTION: reflux  . Colchicine     REACTION: myalgia    Current Outpatient Prescriptions on File Prior to Visit  Medication Sig Dispense Refill  . albuterol (PROAIR HFA) 108 (90 BASE) MCG/ACT inhaler Inhale 2 puffs into the lungs as needed.        Marland Kitchen allopurinol (ZYLOPRIM) 300 MG tablet TAKE 1 TABLET BY MOUTH DAILY  30 tablet  5  . BENICAR 40 MG tablet TAKE ONE TABLET BY MOUTH DAILY  30 tablet  3  . Fluticasone-Salmeterol (ADVAIR DISKUS) 250-50 MCG/DOSE AEPB Inhale 1 puff into the lungs 2 (two) times daily.  60 each  11  . furosemide (LASIX) 20 MG tablet Take 20 mg by mouth daily as needed.      . labetalol (NORMODYNE) 200  MG tablet TAKE ONE TABLET BY MOUTH TWICE DAILY  180 tablet  0  . Multiple Vitamins-Minerals (CENTRUM SILVER PO) Take by mouth daily.        Marland Kitchen omeprazole (PRILOSEC) 40 MG capsule Take 1 capsule (40 mg total) by mouth daily.  30 capsule  3  . predniSONE (DELTASONE) 2.5 MG tablet HOLD until off 10mg  prednisone then resume  100 tablet  0  . VITAMIN D, CHOLECALCIFEROL, PO Take 1,000 Units by mouth daily.           patient denies chest pain, shortness of breath, orthopnea. Denies lower extremity edema, abdominal pain, change in appetite, change in bowel movements. Patient denies rashes, musculoskeletal complaints. No other specific complaints in a complete review of systems.   BP 144/88  Pulse 80  Temp 97.9 F (36.6 C) (Oral)  Wt 137 lb (62.143 kg)  Well-developed well-nourished female in no acute distress. HEENT exam atraumatic, normocephalic, extraocular muscles are intact. Neck is supple. No jugular venous distention no thyromegaly. Chest clear to auscultation without increased work of breathing.  Cardiac exam S1 and S2 are regular. Abdominal exam active bowel sounds, soft, nontender. Extremities no edema. Neurologic exam she is alert without any motor sensory deficits. Gait is normal.

## 2012-03-18 NOTE — Assessment & Plan Note (Signed)
Still on low dose prednisone Doing well

## 2012-03-18 NOTE — Assessment & Plan Note (Signed)
Discussed meds with dr. Delford Field he suggests a different beta blocker D/c labetolol Start carvedilol

## 2012-03-18 NOTE — Assessment & Plan Note (Signed)
Clinically doing well on current meds

## 2012-05-27 ENCOUNTER — Other Ambulatory Visit: Payer: Self-pay | Admitting: Internal Medicine

## 2012-05-30 ENCOUNTER — Ambulatory Visit (INDEPENDENT_AMBULATORY_CARE_PROVIDER_SITE_OTHER): Payer: Medicare Other | Admitting: Family

## 2012-05-30 ENCOUNTER — Encounter: Payer: Self-pay | Admitting: Family

## 2012-05-30 VITALS — BP 160/90 | HR 87 | Temp 97.6°F | Wt 138.0 lb

## 2012-05-30 DIAGNOSIS — J029 Acute pharyngitis, unspecified: Secondary | ICD-10-CM

## 2012-05-30 DIAGNOSIS — I10 Essential (primary) hypertension: Secondary | ICD-10-CM

## 2012-05-30 NOTE — Patient Instructions (Addendum)
CLARITIN, ZYRTEC, OR ALLEGRA ONCE DAILY.    Viral Pharyngitis Viral pharyngitis is a viral infection that produces redness, pain, and swelling (inflammation) of the throat. It can spread from person to person (contagious). CAUSES Viral pharyngitis is caused by inhaling a large amount of certain germs called viruses. Many different viruses cause viral pharyngitis. SYMPTOMS Symptoms of viral pharyngitis include:  Sore throat.  Tiredness.  Stuffy nose.  Low-grade fever.  Congestion.  Cough. TREATMENT Treatment includes rest, drinking plenty of fluids, and the use of over-the-counter medication (approved by your caregiver). HOME CARE INSTRUCTIONS   Drink enough fluids to keep your urine clear or pale yellow.  Eat soft, cold foods such as ice cream, frozen ice pops, or gelatin dessert.  Gargle with warm salt water (1 tsp salt per 1 qt of water).  If over age 50, throat lozenges may be used safely.  Only take over-the-counter or prescription medicines for pain, discomfort, or fever as directed by your caregiver. Do not take aspirin. To help prevent spreading viral pharyngitis to others, avoid:  Mouth-to-mouth contact with others.  Sharing utensils for eating and drinking.  Coughing around others. SEEK MEDICAL CARE IF:   You are better in a few days, then become worse.  You have a fever or pain not helped by pain medicines.  There are any other changes that concern you. Document Released: 04/29/2005 Document Revised: 10/12/2011 Document Reviewed: 09/25/2010 [facility] Patient Information 2013 [place], [place].

## 2012-05-30 NOTE — Progress Notes (Signed)
Subjective:    Patient ID: [name], female    DOB: 03-05-1920, 78 y.o.   MRN: [id number]  HPI 78 year old white female, nonsmoker, patient of Dr. Cato Mulligan is in today with complaints of a sore throat that began yesterday. She's had sneezing but denies any cough or congestion. She's been taken Mucinex DM that is helped her symptoms. Patient also has a history of hypertension that is usually well controlled. Today, her blood pressure is elevated. Denies any chest pain or palpitations.    Review of Systems  Constitutional: Negative.   HENT: Positive for sore throat and sneezing. Negative for congestion and rhinorrhea.   Eyes: Negative.   Respiratory: Negative.   Cardiovascular: Negative.   Skin: Negative.   Psychiatric/Behavioral: Negative.    Past Medical History  Diagnosis Date  . Allergy     rhinitis  . COPD (chronic obstructive pulmonary disease)     stage II Golds  . Hyperlipidemia   . Hypertension   . Osteoporosis   . Gout   . PMR (polymyalgia rheumatica)     cylic prednisone    History   Social History  . Marital Status: Widowed    Spouse Name: N/A    Number of Children: N/A  . Years of Education: N/A   Occupational History  . Not on file.   Social History Main Topics  . Smoking status: Former Smoker -- 0.8 packs/day for 26 years    Types: Cigarettes    Quit date: 08/03/1997  . Smokeless tobacco: Never Used  . Alcohol Use: 0.0 oz/week  . Drug Use: No  . Sexually Active: Not on file   Other Topics Concern  . Not on file   Social History Narrative  . No narrative on file    Past Surgical History  Procedure Date  . Dilation and curettage of uterus   . Fractured arm     surgical repair  . Thyroglossal duct cyst     Family History  Problem Relation Age of Onset  . Heart disease Mother   . Diabetes Mother   . Cancer Father     lung    Allergies  Allergen Reactions  . Aspirin     REACTION: reflux  . Colchicine     REACTION: myalgia     Current Outpatient Prescriptions on File Prior to Visit  Medication Sig Dispense Refill  . albuterol (PROAIR HFA) 108 (90 BASE) MCG/ACT inhaler Inhale 2 puffs into the lungs as needed.        Marland Kitchen allopurinol (ZYLOPRIM) 300 MG tablet TAKE 1 TABLET BY MOUTH DAILY  30 tablet  4  . BENICAR 40 MG tablet TAKE ONE TABLET BY MOUTH DAILY  30 tablet  3  . carvedilol (COREG) 12.5 MG tablet Take 1 tablet (12.5 mg total) by mouth 2 (two) times daily with a meal.  60 tablet  3  . Fluticasone-Salmeterol (ADVAIR DISKUS) 250-50 MCG/DOSE AEPB Inhale 1 puff into the lungs 2 (two) times daily.  60 each  11  . furosemide (LASIX) 20 MG tablet Take 20 mg by mouth daily as needed.      . Multiple Vitamins-Minerals (CENTRUM SILVER PO) Take by mouth daily.        Marland Kitchen omeprazole (PRILOSEC) 40 MG capsule Take 1 capsule (40 mg total) by mouth daily.  30 capsule  3  . predniSONE (DELTASONE) 2.5 MG tablet HOLD until off 10mg  prednisone then resume  100 tablet  0  . VITAMIN D, CHOLECALCIFEROL, PO Take 1,000  Units by mouth daily.          BP 160/90  Pulse 87  Temp 97.6 F (36.4 C) (Oral)  Wt 138 lb (62.596 kg)  SpO2 96%chart    Objective:   Physical Exam  Constitutional: She is oriented to person, place, and time. She appears well-developed and well-nourished.  HENT:  Right Ear: External ear normal.  Left Ear: External ear normal.  Nose: Nose normal.  Mouth/Throat: Oropharynx is clear and moist.       Pharynx moderately red  Cardiovascular: Normal rate, regular rhythm and normal heart sounds.   Pulmonary/Chest: Effort normal and breath sounds normal.  Neurological: She is alert and oriented to person, place, and time.  Skin: Skin is warm and dry.  Psychiatric: She has a normal mood and affect.          Assessment & Plan:  Assessment: Pharyngitis-improving, hypertension-uncontrolled  Plan: Discontinue Mucinex DM. Advise an antihistamine like Claritin, Zyrtec, or Allegra once daily. Will reevaluate her  blood pressure when she sees Dr. Cato Mulligan next month for her routine exam. I believe her blood pressure elevation today is related to the dextromethorphan.

## 2012-06-01 ENCOUNTER — Telehealth: Payer: Self-pay | Admitting: Critical Care Medicine

## 2012-06-01 MED ORDER — MOXIFLOXACIN HCL 400 MG PO TABS: 400.0000 mg | ORAL_TABLET | Freq: Every day | ORAL | Status: DC

## 2012-06-01 MED ORDER — PREDNISONE 10 MG PO TABS: ORAL_TABLET | ORAL | Status: DC

## 2012-06-01 NOTE — Telephone Encounter (Signed)
Called, spoke with pt.  States on Sunday she started having a tight feeling in the back of her throat.  On Monday, she was see by PCP office and was asked to cont with mucinex and start claritin.  Pt states she is doing both but now "feels like it is going into my chest." Reports she has a little wheezing, is sneezing, and feels like she does when she gets bronchitis.  Denies increased SOB, cough, or fever.  Offered OV but pt declined.  States she does not feel she can come in.  She is requesting something be called in.  She does have a pending OV with Dr. Delford Field on 06/15/12.  Dr. Delford Field, pls advise.  Thank you.    Karin Golden [employer]

## 2012-06-01 NOTE — Telephone Encounter (Signed)
Call in  avelox 400mg /d x 5days Prednisone 10mg  Take 4 for two days three for two days two for two days one for two days #20  Keep next OV

## 2012-06-01 NOTE — Telephone Encounter (Signed)
Called spoke with patient, advised of PW's recs as stated below.  Pt verbalized her understanding and will keep 11.13.13 ov w/ PW.  Rx sent to verified pharmacy and pt to call if her symptoms do not improve or worsen.

## 2012-06-02 ENCOUNTER — Telehealth: Payer: Self-pay | Admitting: Critical Care Medicine

## 2012-06-02 NOTE — Telephone Encounter (Signed)
Stop avelox Finish prednisone RX Needs OV for eval this week with TP

## 2012-06-02 NOTE — Telephone Encounter (Signed)
Spoke with patient--she reports that she took first dose of prednisone and Avelox that was prescibed to her on 06/01/12 last night and about and hour later she began having tremors, shakiness, hot flashes and just felt really awful. Patient says she almost thought she was going to have to call 911. Patient says she has taken prednisone several times prior to this and has had no problems, but has not taken Avelox before and will not take another dose.  Pt requesting recs from Dr. Delford Field, please advise. Thank You

## 2012-06-02 NOTE — Telephone Encounter (Signed)
Pt aware of PW recs and will see TP on Fri., 06/03/12 @ 10:15.

## 2012-06-03 ENCOUNTER — Ambulatory Visit (INDEPENDENT_AMBULATORY_CARE_PROVIDER_SITE_OTHER): Payer: Medicare Other | Admitting: Adult Health

## 2012-06-03 ENCOUNTER — Encounter: Payer: Self-pay | Admitting: Adult Health

## 2012-06-03 VITALS — BP 128/84 | HR 93 | Temp 97.6°F | Wt 139.6 lb

## 2012-06-03 DIAGNOSIS — J449 Chronic obstructive pulmonary disease, unspecified: Secondary | ICD-10-CM

## 2012-06-03 NOTE — Patient Instructions (Addendum)
Finished prednisone as directed. Mucinex DM twice daily as needed. For cough and congestion. Fluids and rest. Follow Dr. Delford Field as planned and as needed. Please contact office for sooner follow up if symptoms do not improve or worsen or seek emergency care

## 2012-06-03 NOTE — Progress Notes (Signed)
Subjective:    Patient ID: [name], female    DOB: 1919-08-20, 78 y.o.   MRN: [id number]  HPI  78 y.o.  WF Copd   06/03/2012 Acute OV  Complains of 5 days of sore throat , cough, and shortness of breath.  Seen by PCP given claritin for pharyngitis.  Still coughing , called our office 10/30 , called in Avelox  Was unable to tolerate avelox due to tremors, shakiness,, hot flashes, vision changes She stopped avelox and was called in steroid taper .  Now Feeling much better. Congestion is clear.  She denies any hemoptysis, orthopnea, PND, or leg swelling. No fever   Past Medical History  Diagnosis Date  . Allergy     rhinitis  . COPD (chronic obstructive pulmonary disease)     stage II Golds  . Hyperlipidemia   . Hypertension   . Osteoporosis   . Gout   . PMR (polymyalgia rheumatica)     cylic prednisone     Family History  Problem Relation Age of Onset  . Heart disease Mother   . Diabetes Mother   . Cancer Father     lung     History   Social History  . Marital Status: Widowed    Spouse Name: N/A    Number of Children: N/A  . Years of Education: N/A   Occupational History  . Not on file.   Social History Main Topics  . Smoking status: Former Smoker -- 0.8 packs/day for 26 years    Types: Cigarettes    Quit date: 08/03/1997  . Smokeless tobacco: Never Used  . Alcohol Use: 0.0 oz/week  . Drug Use: No  . Sexually Active: Not on file   Other Topics Concern  . Not on file   Social History Narrative  . No narrative on file     Allergies  Allergen Reactions  . Aspirin     REACTION: reflux  . Colchicine     REACTION: myalgia     Outpatient Prescriptions Prior to Visit  Medication Sig Dispense Refill  . albuterol (PROAIR HFA) 108 (90 BASE) MCG/ACT inhaler Inhale 2 puffs into the lungs as needed.        Marland Kitchen allopurinol (ZYLOPRIM) 300 MG tablet TAKE 1 TABLET BY MOUTH DAILY  30 tablet  4  . BENICAR 40 MG tablet TAKE ONE TABLET BY MOUTH DAILY  30  tablet  3  . carvedilol (COREG) 12.5 MG tablet Take 1 tablet (12.5 mg total) by mouth 2 (two) times daily with a meal.  60 tablet  3  . Fluticasone-Salmeterol (ADVAIR DISKUS) 250-50 MCG/DOSE AEPB Inhale 1 puff into the lungs 2 (two) times daily.  60 each  11  . furosemide (LASIX) 20 MG tablet Take 20 mg by mouth daily as needed.      . Multiple Vitamins-Minerals (CENTRUM SILVER PO) Take by mouth daily.        . predniSONE (DELTASONE) 10 MG tablet Take 4 tabs for 2 days, then 3 for 2 days, 2 tabs for 2 days, 1 for 2 days and stop.  20 tablet  0  . VITAMIN D, CHOLECALCIFEROL, PO Take 1,000 Units by mouth daily.        Marland Kitchen omeprazole (PRILOSEC) 40 MG capsule Take 1 capsule (40 mg total) by mouth daily.  30 capsule  3  . predniSONE (DELTASONE) 2.5 MG tablet HOLD until off 10mg  prednisone then resume  100 tablet  0  . moxifloxacin (AVELOX) 400 MG  tablet Take 1 tablet (400 mg total) by mouth daily.  5 tablet  0     Review of Systems  Constitutional:   No  weight loss, night sweats,  Fevers, chills, fatigue, lassitude. HEENT:   No headaches,  Difficulty swallowing,  Tooth/dental problems,  Sore throat,                No sneezing, itching, ear ache,  +nasal congestion, post nasal drip,   CV:  No chest pain,  Orthopnea, PND, notes mild swelling in lower extremities, no anasarca, dizziness, palpitations  GI  No heartburn, indigestion, abdominal pain, nausea, vomiting, diarrhea, change in bowel habits, loss of appetite  Resp:   No coughing up of blood.  No change in color of mucus.  No wheezing.  No chest wall deformity  Skin: no rash or lesions.  GU: no dysuria, change in color of urine, no urgency or frequency.  No flank pain.  MS:  No joint pain or swelling.  No decreased range of motion.  No back pain.  Psych:  No change in mood or affect. No depression or anxiety.  No memory loss.     Objective:   Physical Exam  Filed Vitals:   06/03/12 1028  BP: 128/84  Pulse: 93  Temp: 97.6 F  (36.4 C)  TempSrc: Oral  Weight: 139 lb 9.6 oz (63.322 kg)  SpO2: 94%    Gen: Pleasant, well-nourished, in no distress,  normal affect  ENT: No lesions,  mouth clear,  oropharynx clear, no postnasal drip  Neck: No JVD, no TMG, no carotid bruits  Lungs: No use of accessory muscles, no dullness to percussion, distant BS  Cardiovascular: RRR, heart sounds normal, no murmur or gallops, no peripheral edema  Abdomen: soft and NT, no HSM,  BS normal  Musculoskeletal: No deformities, no cyanosis or clubbing  Neuro: alert, non focal  Skin: Warm, no lesions or rashes       PFT Conversion 12/20/2009  FVC 1.71  FVC PREDICT   FVC  % Predicted 102.4  FEV1 1.21  FEV1 PREDICT 1.20  FEV % Predicted 100.3  FEV1/FVC 70.8  FEV1/FVC PRE   FEV1/FVC%EXP 97.60  FeF 25-75   FeF 25-75 % Predicted   FEF % EXPEC   PEF % EXPECT 3.29    Assessment & Plan:

## 2012-06-03 NOTE — Assessment & Plan Note (Addendum)
Flare -resolving w/ steroid taper   Plan Finished prednisone as directed. Mucinex DM twice daily as needed. For cough and congestion. Fluids and rest. Follow Dr. Delford Field as planned and as needed. Please contact office for sooner follow up if symptoms do not improve or worsen or seek emergency care

## 2012-06-13 ENCOUNTER — Telehealth: Payer: Self-pay | Admitting: Pulmonary Disease

## 2012-06-13 NOTE — Telephone Encounter (Signed)
error 

## 2012-06-13 NOTE — Telephone Encounter (Signed)
Error

## 2012-06-15 ENCOUNTER — Ambulatory Visit: Payer: Medicare Other | Admitting: Critical Care Medicine

## 2012-06-15 ENCOUNTER — Other Ambulatory Visit: Payer: Self-pay | Admitting: Internal Medicine

## 2012-06-20 ENCOUNTER — Encounter: Payer: Self-pay | Admitting: Internal Medicine

## 2012-06-20 ENCOUNTER — Ambulatory Visit (INDEPENDENT_AMBULATORY_CARE_PROVIDER_SITE_OTHER): Payer: Medicare Other | Admitting: Internal Medicine

## 2012-06-20 VITALS — BP 142/66 | Temp 97.9°F | Wt 139.0 lb

## 2012-06-20 DIAGNOSIS — M2040 Other hammer toe(s) (acquired), unspecified foot: Secondary | ICD-10-CM

## 2012-06-20 DIAGNOSIS — M353 Polymyalgia rheumatica: Secondary | ICD-10-CM

## 2012-06-20 MED ORDER — PREDNISONE 2.5 MG PO TABS: 2.5000 mg | ORAL_TABLET | ORAL | Status: DC

## 2012-06-20 NOTE — Assessment & Plan Note (Signed)
Decrease dose of prednisone to qod

## 2012-06-20 NOTE — Progress Notes (Signed)
Patient ID: [name], female   DOB: 09-22-1919, 78 y.o.   MRN: [id number] 6 week hx of "misshapen right 2nd toe". No trauma, minimal discomfort.  PMR-- doing great on prednisone 2.5 mg  Reviewed pmh, psh, soc hx  ROS- unremarkable  Exam-- hammer toe- right 2nd toe  A/P- hammer toe-- no treatment at this time

## 2012-06-21 ENCOUNTER — Other Ambulatory Visit: Payer: Self-pay | Admitting: Internal Medicine

## 2012-06-22 ENCOUNTER — Other Ambulatory Visit: Payer: Self-pay | Admitting: *Deleted

## 2012-06-22 MED ORDER — PREDNISONE 2.5 MG PO TABS: 2.5000 mg | ORAL_TABLET | ORAL | Status: DC

## 2012-07-13 ENCOUNTER — Encounter: Payer: Self-pay | Admitting: Family

## 2012-07-13 ENCOUNTER — Ambulatory Visit (INDEPENDENT_AMBULATORY_CARE_PROVIDER_SITE_OTHER): Payer: Medicare Other | Admitting: Family

## 2012-07-13 VITALS — BP 142/90 | HR 85 | Temp 97.5°F | Wt 141.0 lb

## 2012-07-13 DIAGNOSIS — J309 Allergic rhinitis, unspecified: Secondary | ICD-10-CM

## 2012-07-13 DIAGNOSIS — R0982 Postnasal drip: Secondary | ICD-10-CM

## 2012-07-13 MED ORDER — FLUTICASONE PROPIONATE 50 MCG/ACT NA SUSP: 2.0000 | Freq: Every day | NASAL | Status: DC

## 2012-07-13 NOTE — Patient Instructions (Signed)
Flonase 2 sprays in each nostril once a day.  Claritin 10mg  once daily.  Allergic Rhinitis Allergic rhinitis is when the mucous membranes in the nose respond to allergens. Allergens are particles in the air that cause your body to have an allergic reaction. This causes you to release allergic antibodies. Through a chain of events, these eventually cause you to release histamine into the blood stream (hence the use of antihistamines). Although meant to be protective to the body, it is this release that causes your discomfort, such as frequent sneezing, congestion and an itchy runny nose.  CAUSES  The pollen allergens may come from grasses, trees, and weeds. This is seasonal allergic rhinitis, or "hay fever." Other allergens cause year-round allergic rhinitis (perennial allergic rhinitis) such as house dust mite allergen, pet dander and mold spores.  SYMPTOMS   Nasal stuffiness (congestion).  Runny, itchy nose with sneezing and tearing of the eyes.  There is often an itching of the mouth, eyes and ears. It cannot be cured, but it can be controlled with medications. DIAGNOSIS  If you are unable to determine the offending allergen, skin or blood testing may find it. TREATMENT   Avoid the allergen.  Medications and allergy shots (immunotherapy) can help.  Hay fever may often be treated with antihistamines in pill or nasal spray forms. Antihistamines block the effects of histamine. There are over-the-counter medicines that may help with nasal congestion and swelling around the eyes. Check with your caregiver before taking or giving this medicine. If the treatment above does not work, there are many new medications your caregiver can prescribe. Stronger medications may be used if initial measures are ineffective. Desensitizing injections can be used if medications and avoidance fails. Desensitization is when a patient is given ongoing shots until the body becomes less sensitive to the allergen.  Make sure you follow up with your caregiver if problems continue. SEEK MEDICAL CARE IF:   You develop fever (more than 100.5 F (38.1 C).  You develop a cough that does not stop easily (persistent).  You have shortness of breath.  You start wheezing.  Symptoms interfere with normal daily activities. Document Released: 04/14/2001 Document Revised: 10/12/2011 Document Reviewed: 10/24/2008 [facility] Patient Information 2013 [place], [place].

## 2012-07-13 NOTE — Progress Notes (Signed)
Subjective:    Patient ID: [name], female    DOB: 05-Jul-1920, 78 y.o.   MRN: [id number]  HPI 78 year old female, nonsmoker, patient of Dr. Cato Mulligan is in today with complaints of postnasal drip, cough, nasal congestion x4 days. Cough is productive with green phlegm. Has been taken over-the-counter Mucinex with no relief. Denies any fever, muscle aches or pain. Appetite normal.   Review of Systems  Constitutional: Negative.   HENT: Positive for congestion, rhinorrhea, sneezing and postnasal drip.   Eyes: Negative.   Respiratory: Negative.   Cardiovascular: Negative.   Skin: Negative.   Neurological: Negative.   Hematological: Negative.   Psychiatric/Behavioral: Negative.    Past Medical History  Diagnosis Date  . Allergy     rhinitis  . COPD (chronic obstructive pulmonary disease)     stage II Golds  . Hyperlipidemia   . Hypertension   . Osteoporosis   . Gout   . PMR (polymyalgia rheumatica)     cylic prednisone    History   Social History  . Marital Status: Widowed    Spouse Name: N/A    Number of Children: N/A  . Years of Education: N/A   Occupational History  . Not on file.   Social History Main Topics  . Smoking status: Former Smoker -- 0.8 packs/day for 26 years    Types: Cigarettes    Quit date: 08/03/1997  . Smokeless tobacco: Never Used  . Alcohol Use: 0.0 oz/week  . Drug Use: No  . Sexually Active: Not on file   Other Topics Concern  . Not on file   Social History Narrative  . No narrative on file    Past Surgical History  Procedure Date  . Dilation and curettage of uterus   . Fractured arm     surgical repair  . Thyroglossal duct cyst     Family History  Problem Relation Age of Onset  . Heart disease Mother   . Diabetes Mother   . Cancer Father     lung    Allergies  Allergen Reactions  . Aspirin     REACTION: reflux  . Avelox (Moxifloxacin Hcl In Nacl)     Tremors , shakes, nausea   . Colchicine     REACTION: myalgia     Current Outpatient Prescriptions on File Prior to Visit  Medication Sig Dispense Refill  . allopurinol (ZYLOPRIM) 300 MG tablet TAKE 1 TABLET BY MOUTH DAILY  30 tablet  4  . BENICAR 40 MG tablet TAKE ONE TABLET BY MOUTH DAILY  30 tablet  3  . carvedilol (COREG) 12.5 MG tablet Take 1 tablet (12.5 mg total) by mouth 2 (two) times daily with a meal.  60 tablet  3  . Fluticasone-Salmeterol (ADVAIR DISKUS) 250-50 MCG/DOSE AEPB Inhale 1 puff into the lungs 2 (two) times daily.  60 each  11  . furosemide (LASIX) 20 MG tablet Take 20 mg by mouth daily as needed.      . loratadine (CLARITIN) 10 MG tablet Take 10 mg by mouth daily as needed.      . Multiple Vitamins-Minerals (CENTRUM SILVER PO) Take by mouth daily.        Marland Kitchen omeprazole (PRILOSEC) 40 MG capsule Take 1 capsule (40 mg total) by mouth daily.  30 capsule  3  . predniSONE (DELTASONE) 2.5 MG tablet Take 1 tablet (2.5 mg total) by mouth every other day. HOLD until off 10mg  prednisone then resume  100 tablet  0  .  VITAMIN D, CHOLECALCIFEROL, PO Take 1,000 Units by mouth daily.        . fluticasone (FLONASE) 50 MCG/ACT nasal spray Place 2 sprays into the nose daily.  16 g  3    BP 142/90  Pulse 85  Temp 97.5 F (36.4 C) (Oral)  Wt 141 lb (63.957 kg)  SpO2 94%chart    Objective:   Physical Exam  Constitutional: She is oriented to person, place, and time. She appears well-developed and well-nourished.  HENT:  Right Ear: External ear normal.  Left Ear: External ear normal.  Nose: Nose normal.  Mouth/Throat: Oropharynx is clear and moist.  Neck: Normal range of motion. Neck supple.  Cardiovascular: Normal rate, regular rhythm and normal heart sounds.   Pulmonary/Chest: Effort normal and breath sounds normal.  Neurological: She is alert and oriented to person, place, and time.  Skin: Skin is warm and dry.  Psychiatric: She has a normal mood and affect.          Assessment & Plan:  Assessment: Postnasal drip, allergic  rhinitis  Plan: Claritin 10 mg once daily. Flonase 2 sprays in each nostril once a day. Rest. Drink plenty of fluids. Patient to call the office if symptoms worsen or persist. Recheck a schedule, and when necessary.

## 2012-08-10 ENCOUNTER — Other Ambulatory Visit: Payer: Self-pay | Admitting: Internal Medicine

## 2012-08-18 ENCOUNTER — Other Ambulatory Visit: Payer: Self-pay | Admitting: Internal Medicine

## 2012-09-06 ENCOUNTER — Encounter: Payer: Self-pay | Admitting: Family

## 2012-09-06 ENCOUNTER — Telehealth: Payer: Self-pay | Admitting: Critical Care Medicine

## 2012-09-06 ENCOUNTER — Ambulatory Visit (INDEPENDENT_AMBULATORY_CARE_PROVIDER_SITE_OTHER): Payer: Medicare Other | Admitting: Family

## 2012-09-06 VITALS — BP 138/60 | HR 87 | Temp 98.1°F | Ht 59.0 in | Wt 142.0 lb

## 2012-09-06 DIAGNOSIS — R05 Cough: Secondary | ICD-10-CM

## 2012-09-06 DIAGNOSIS — J209 Acute bronchitis, unspecified: Secondary | ICD-10-CM

## 2012-09-06 MED ORDER — METHYLPREDNISOLONE 4 MG PO KIT: PACK | ORAL | Status: AC

## 2012-09-06 NOTE — Patient Instructions (Addendum)

## 2012-09-06 NOTE — Progress Notes (Signed)
Subjective:    Patient ID: [name], female    DOB: 05/20/1920, 78 y.o.   MRN: [id number]  HPI 79 year old white female, nonsmoker, patient of Dr. Cato Mulligan is in today with complaints of cough, nasal congestion, and wheezing x2 days. She has a history of COPD and has been using Advair 250/50 twice a day that typically works well. Recently, she's been using Flonase and Mucinex that has not cleared her symptoms. Denies any fever, muscle aches or pain.   Review of Systems  Constitutional: Negative.   HENT: Positive for congestion and postnasal drip.   Respiratory: Positive for cough and wheezing.   Cardiovascular: Negative.   Musculoskeletal: Negative.   Skin: Negative.   Hematological: Negative.   Psychiatric/Behavioral: Negative.    Past Medical History  Diagnosis Date  . Allergy     rhinitis  . COPD (chronic obstructive pulmonary disease)     stage II Golds  . Hyperlipidemia   . Hypertension   . Osteoporosis   . Gout   . PMR (polymyalgia rheumatica)     cylic prednisone    History   Social History  . Marital Status: Widowed    Spouse Name: N/A    Number of Children: N/A  . Years of Education: N/A   Occupational History  . Not on file.   Social History Main Topics  . Smoking status: Former Smoker -- 0.8 packs/day for 26 years    Types: Cigarettes    Quit date: 08/03/1997  . Smokeless tobacco: Never Used  . Alcohol Use: 0.0 oz/week  . Drug Use: No  . Sexually Active: Not on file   Other Topics Concern  . Not on file   Social History Narrative  . No narrative on file    Past Surgical History  Procedure Date  . Dilation and curettage of uterus   . Fractured arm     surgical repair  . Thyroglossal duct cyst     Family History  Problem Relation Age of Onset  . Heart disease Mother   . Diabetes Mother   . Cancer Father     lung    Allergies  Allergen Reactions  . Aspirin     REACTION: reflux  . Avelox (Moxifloxacin Hcl In Nacl)    Tremors , shakes, nausea   . Colchicine     REACTION: myalgia    Current Outpatient Prescriptions on File Prior to Visit  Medication Sig Dispense Refill  . allopurinol (ZYLOPRIM) 300 MG tablet TAKE 1 TABLET BY MOUTH DAILY  30 tablet  4  . BENICAR 40 MG tablet TAKE ONE TABLET BY MOUTH DAILY  30 tablet  2  . fluticasone (FLONASE) 50 MCG/ACT nasal spray Place 2 sprays into the nose daily.  16 g  3  . Fluticasone-Salmeterol (ADVAIR DISKUS) 250-50 MCG/DOSE AEPB Inhale 1 puff into the lungs 2 (two) times daily.  60 each  11  . Multiple Vitamins-Minerals (CENTRUM SILVER PO) Take by mouth daily.        Marland Kitchen omeprazole (PRILOSEC) 40 MG capsule Take 1 capsule (40 mg total) by mouth daily.  30 capsule  3  . predniSONE (DELTASONE) 2.5 MG tablet Take 1 tablet (2.5 mg total) by mouth every other day. HOLD until off 10mg  prednisone then resume  100 tablet  0  . VITAMIN D, CHOLECALCIFEROL, PO Take 1,000 Units by mouth daily.        . carvedilol (COREG) 12.5 MG tablet TAKE ONE TABLET BY MOUTH TWICE DAILY WITH MEALS  60 tablet  2  . furosemide (LASIX) 20 MG tablet Take 20 mg by mouth daily as needed.      . loratadine (CLARITIN) 10 MG tablet Take 10 mg by mouth daily as needed.        BP 138/60  Pulse 87  Temp 98.1 F (36.7 C) (Oral)  Ht 4\' 11"  (1.499 m)  Wt 142 lb (64.411 kg)  BMI 28.68 kg/m2  SpO2 96%chart    Objective:   Physical Exam  Constitutional: She is oriented to person, place, and time. She appears well-developed and well-nourished.  HENT:  Right Ear: External ear normal.  Left Ear: External ear normal.  Nose: Nose normal.  Mouth/Throat: Oropharynx is clear and moist.  Neck: Normal range of motion. Neck supple.  Cardiovascular: Normal rate, regular rhythm and normal heart sounds.   Pulmonary/Chest: Effort normal.       Mild expiratory wheezing in the bases  Neurological: She is alert and oriented to person, place, and time.  Skin: Skin is warm and dry.  Psychiatric: She has a normal  mood and affect.          Assessment & Plan:  Assessment:  1. Bronchitis-acute 2. Cough  Plan: Medrol Dosepak as directed. Rest. Drink plenty of fluids. Patient about to call the office if symptoms worsen or persist. Recheck a schedule, and as needed.

## 2012-09-06 NOTE — Telephone Encounter (Signed)
No need for message. °

## 2012-09-19 ENCOUNTER — Ambulatory Visit (INDEPENDENT_AMBULATORY_CARE_PROVIDER_SITE_OTHER): Payer: Medicare Other | Admitting: Internal Medicine

## 2012-09-19 ENCOUNTER — Ambulatory Visit (INDEPENDENT_AMBULATORY_CARE_PROVIDER_SITE_OTHER)
Admission: RE | Admit: 2012-09-19 | Discharge: 2012-09-19 | Disposition: A | Discharge status: Home / Self Care | Payer: Medicare Other | Source: Ambulatory Visit | Attending: Internal Medicine | Admitting: Internal Medicine

## 2012-09-19 ENCOUNTER — Encounter: Payer: Self-pay | Admitting: Internal Medicine

## 2012-09-19 VITALS — BP 152/72 | HR 94 | Temp 97.3°F | Wt 141.0 lb

## 2012-09-19 DIAGNOSIS — J449 Chronic obstructive pulmonary disease, unspecified: Secondary | ICD-10-CM

## 2012-09-19 MED ORDER — DOXYCYCLINE HYCLATE 100 MG PO TABS: 100.0000 mg | ORAL_TABLET | Freq: Two times a day (BID) | ORAL | Status: AC

## 2012-09-19 NOTE — Assessment & Plan Note (Signed)
i suspect she is having an exacerbation-- could be infectious given abnl lung exam emperically treat with ABX and check cxr

## 2012-09-19 NOTE — Progress Notes (Signed)
Reviewed padondas note She has some sinus congestion Still with cough Has significant wheezing. She feels quite tired No fever or chills.  Reviewed pmh,psh, soc hx.   patient denies chest pain,, she has minimal sob. No other specific complaints.   Well-developed well-nourished female in no acute distress. HEENT exam atraumatic, normocephalic, extraocular muscles are intact. Neck is supple. No jugular venous distention no thyromegaly. Chest with bilateral wheezing and coarse rhonchi on the right. without increased work of breathing. Extremities no edema. Neurologic exam she is alert without any motor sensory deficits. Gait is normal. . Cardiac exam S1 and S2 are regular.

## 2012-10-13 ENCOUNTER — Other Ambulatory Visit: Payer: Self-pay | Admitting: Internal Medicine

## 2012-11-01 ENCOUNTER — Other Ambulatory Visit: Payer: Self-pay | Admitting: Internal Medicine

## 2012-11-28 ENCOUNTER — Other Ambulatory Visit: Payer: Self-pay | Admitting: Internal Medicine

## 2012-11-29 ENCOUNTER — Ambulatory Visit (INDEPENDENT_AMBULATORY_CARE_PROVIDER_SITE_OTHER): Payer: Medicare Other | Admitting: Internal Medicine

## 2012-11-29 VITALS — BP 132/74 | HR 92 | Temp 97.0°F | Ht 59.0 in | Wt 146.0 lb

## 2012-11-29 DIAGNOSIS — E785 Hyperlipidemia, unspecified: Secondary | ICD-10-CM

## 2012-11-29 DIAGNOSIS — R609 Edema, unspecified: Secondary | ICD-10-CM

## 2012-11-29 DIAGNOSIS — M353 Polymyalgia rheumatica: Secondary | ICD-10-CM

## 2012-11-29 DIAGNOSIS — R600 Localized edema: Secondary | ICD-10-CM

## 2012-11-29 DIAGNOSIS — I10 Essential (primary) hypertension: Secondary | ICD-10-CM

## 2012-11-29 DIAGNOSIS — J4489 Other specified chronic obstructive pulmonary disease: Secondary | ICD-10-CM

## 2012-11-29 DIAGNOSIS — J449 Chronic obstructive pulmonary disease, unspecified: Secondary | ICD-10-CM

## 2012-11-29 LAB — BASIC METABOLIC PANEL
CO2: 23 mEq/L (ref 19–32)
Calcium: 9.2 mg/dL (ref 8.4–10.5)
Creatinine, Ser: 0.6 mg/dL (ref 0.4–1.2)
GFR: 97.83 mL/min (ref >60.00)
Glucose, Bld: 87 mg/dL (ref 70–99)
Sodium: 140 mEq/L (ref 135–145)

## 2012-11-29 LAB — CBC WITH DIFFERENTIAL/PLATELET
Basophils Absolute: 0 10*3/uL (ref 0.0–0.1)
Eosinophils Absolute: 0.1 10*3/uL (ref 0.0–0.7)
Hemoglobin: 13.9 g/dL (ref 12.0–15.0)
Lymphocytes Relative: 26.8 % (ref 12.0–46.0)
MCHC: 35.8 g/dL (ref 30.0–36.0)
Monocytes Relative: 9.7 % (ref 3.0–12.0)
Neutro Abs: 3 10*3/uL (ref 1.4–7.7)
Neutrophils Relative %: 61.2 % (ref 43.0–77.0)
Platelets: 176 10*3/uL (ref 150.0–400.0)
RDW: 14 % (ref 11.5–14.6)

## 2012-11-29 LAB — HEPATIC FUNCTION PANEL
AST: 20 U/L (ref 0–37)
Albumin: 3.7 g/dL (ref 3.5–5.2)
Alkaline Phosphatase: 70 U/L (ref 39–117)
Bilirubin, Direct: 0.1 mg/dL (ref 0.0–0.3)
Total Bilirubin: 0.8 mg/dL (ref 0.3–1.2)

## 2012-11-29 MED ORDER — ALBUTEROL SULFATE HFA 108 (90 BASE) MCG/ACT IN AERS: 2.0000 | INHALATION_SPRAY | Freq: Four times a day (QID) | RESPIRATORY_TRACT | Status: DC | PRN

## 2012-11-29 NOTE — Progress Notes (Signed)
Patient ID: [name], female   DOB: 01/19/1920, 79 y.o.   MRN: [id number]  She has hx of PMR- sxs have recurred- aches and pain of shoulders, neck and back (back pain improves with ambulation). Currently on prednisone 2.5 mg po qod.   Reviewed pmh, psh, sochx, meds  Ros-- diffuse aches and pains  Exam-- elderly Gait is slightly broad based MSK- no effusions  A/p- ?pmr-- check labs

## 2012-12-02 ENCOUNTER — Telehealth: Payer: Self-pay | Admitting: Internal Medicine

## 2012-12-02 MED ORDER — PREDNISONE 10 MG PO TABS: 10.0000 mg | ORAL_TABLET | Freq: Every day | ORAL | Status: DC

## 2012-12-02 NOTE — Telephone Encounter (Signed)
PT called to inquire about her lab results from 11/29/12. Please assist.

## 2012-12-02 NOTE — Telephone Encounter (Signed)
Pt aware of labs  

## 2012-12-15 ENCOUNTER — Telehealth: Payer: Self-pay | Admitting: Internal Medicine

## 2012-12-15 NOTE — Telephone Encounter (Signed)
Patient Information:  Caller Name: Abigael  Phone: [phone]  Patient: [name], [name]  Gender: Female  DOB: 1920-05-06  Age: 79 Years  PCP: Birdie Sons (Adults only)  Office Follow Up:  Does the office need to follow up with this patient?: Yes  Instructions For The Office: Medication update; please call back.   Symptoms  Reason For Call & Symptoms: Called to give medication update to Dr Cato Mulligan. Last office visit 11/29/12. Is trying Prednisone 10 mg for polymylagia.  Reports shoulder, neck and back aches and pain are "a little better" over the last 10 days.  Still has some aching discomfort when sitting or first awakens that improves after shower or when is more active throughout the day.  Asking when should be seen again?  Reviewed Health History In EMR: N/A  Reviewed Medications In EMR: N/A  Reviewed Allergies In EMR: N/A  Reviewed Surgeries / Procedures: N/A  Date of Onset of Symptoms: 12/15/2012  Guideline(s) Used:  No Protocol Available - Information Only  Disposition Per Guideline:   Discuss with PCP and Callback by Nurse Today  Reason For Disposition Reached:   Nursing judgment  Advice Given:  N/A  Patient Will Follow Care Advice:  YES

## 2012-12-19 ENCOUNTER — Other Ambulatory Visit: Payer: Self-pay | Admitting: Internal Medicine

## 2012-12-19 MED ORDER — PREDNISONE 2.5 MG PO TABS: ORAL_TABLET | ORAL | Status: DC

## 2012-12-19 NOTE — Telephone Encounter (Signed)
If symptoms are only a little better than i don't think pmr is the problem Decrease prednisone to 5 mg po qd for 10 days and then go back to her baseline of 2.5 mg po qd

## 2012-12-19 NOTE — Telephone Encounter (Signed)
Pt aware, rx sent in electronically 

## 2013-01-04 ENCOUNTER — Telehealth: Payer: Self-pay | Admitting: Internal Medicine

## 2013-01-04 NOTE — Telephone Encounter (Signed)
Patient Information:  Caller Name: Neko  Phone: [phone]  Patient: Isabel, [name]  Gender: Female  DOB: 10-04-1919  Age: 79 Years  PCP: Birdie Sons (Adults only)  Office Follow Up:  Does the office need to follow up with this patient?: No  Instructions For The Office: N/A  RN Note:  Please advise.  Symptoms  Reason For Call & Symptoms: Pt calling to update regarding polymyalgia after completion of Prednisone as Dr. Cato Mulligan wanted. Pt does not feel like it made a difference. Pt states that she is quite stiff and slow moving in the morning but does get better with movement throughout the day. Triage offered and declinged. Pt states she was just calling to update. Please call pt and let her know if she needs to f/u any sooner than her already scheduled. July 15 appt.  Reviewed Health History In EMR: Yes  Reviewed Medications In EMR: Yes  Reviewed Allergies In EMR: Yes  Reviewed Surgeries / Procedures: Yes  Date of Onset of Symptoms: 01/04/2013  Guideline(s) Used:  No Protocol Available - Information Only  Disposition Per Guideline:   Discuss with PCP and Callback by Nurse Today  Reason For Disposition Reached:   Nursing judgment  Advice Given:  N/A  RN Overrode Recommendation:  Follow Up With Office Later  Script error. Pt will follow recommendation.

## 2013-01-04 NOTE — Telephone Encounter (Signed)
Pls advise if pt needs to be seen sooner.

## 2013-01-04 NOTE — Telephone Encounter (Signed)
Patient had called today 6/4 and left message for call back about her polymyalgia and the Prednisone not working.  Attempted call back to patient but did not get answer, left message for her to call us back.

## 2013-02-01 ENCOUNTER — Other Ambulatory Visit: Payer: Self-pay | Admitting: Internal Medicine

## 2013-02-14 ENCOUNTER — Encounter: Payer: Self-pay | Admitting: Internal Medicine

## 2013-02-14 ENCOUNTER — Ambulatory Visit (INDEPENDENT_AMBULATORY_CARE_PROVIDER_SITE_OTHER): Payer: Medicare Other | Admitting: Internal Medicine

## 2013-02-14 VITALS — BP 140/72 | HR 88 | Temp 98.0°F | Wt 150.0 lb

## 2013-02-14 DIAGNOSIS — M353 Polymyalgia rheumatica: Secondary | ICD-10-CM

## 2013-02-14 MED ORDER — PREDNISONE 20 MG PO TABS: 20.0000 mg | ORAL_TABLET | Freq: Every day | ORAL | Status: DC

## 2013-02-14 NOTE — Progress Notes (Signed)
Patient ID: [name], female   DOB: 14-Feb-1920, 79 y.o.   MRN: [id number]  Several complaints:  Lower extremity edema-- when she wears compression stockings the edema resolves.  Diffuse aches and pains-- has hx of PMR- these complaints are related to hand pain (bilateral) and left sided knee pain-- also complains of shoulder and neck pain and diffuse back pain. (last ESR- normal)  Reviewed meds, pmhx   patient denies chest pain, shortness of breath, orthopnea. Denies lower extremity edema, abdominal pain, change in appetite, change in bowel movements. Patient denies rashes, musculoskeletal complaints (except for above) . No other specific complaints in a complete review of systems.     Well-developed well-nourished female in no acute distress. HEENT exam atraumatic, normocephalic, extraocular muscles are intact. Neck is supple. No jugular venous distention no thyromegaly. Chest clear to auscultation without increased work of breathing. Cardiac exam S1 and S2 are regular. Abdominal exam active bowel sounds, soft, nontender. Extremities no edema. Neurologic exam she is alert without any motor sensory deficits. Gait is normal. msk-- heberden's bouchard's nodes.  A/p- PMR vs diffuse OA- trial of prednisone 20 mg po qd-- see me next week  LE edema-- COMPRESSION STOCKINGS

## 2013-02-14 NOTE — Assessment & Plan Note (Signed)
She may be having an exacerbation. Previous sedimentation rate to be normal. I will not recheck a sedimentation rate. Will try an empiric trial of increase her prednisone to 20 mg daily. I'll followup with her next week.

## 2013-02-24 ENCOUNTER — Encounter: Payer: Self-pay | Admitting: Internal Medicine

## 2013-02-24 ENCOUNTER — Ambulatory Visit (INDEPENDENT_AMBULATORY_CARE_PROVIDER_SITE_OTHER): Payer: Medicare Other | Admitting: Internal Medicine

## 2013-02-24 VITALS — BP 134/64 | Temp 97.4°F | Wt 148.0 lb

## 2013-02-24 DIAGNOSIS — M353 Polymyalgia rheumatica: Secondary | ICD-10-CM

## 2013-02-24 MED ORDER — PREDNISONE 20 MG PO TABS: ORAL_TABLET | ORAL | Status: DC

## 2013-02-24 NOTE — Progress Notes (Signed)
Patient ID: [name], female   DOB: 03-09-1920, 79 y.o.   MRN: [id number]  PMR- MUCh BETTER Prednisone now 20 mg daily  Reviewed previous note and meds  PMR- Decrease prednisone

## 2013-03-01 ENCOUNTER — Encounter: Payer: Self-pay | Admitting: Family

## 2013-03-01 ENCOUNTER — Ambulatory Visit (INDEPENDENT_AMBULATORY_CARE_PROVIDER_SITE_OTHER): Payer: Medicare Other | Admitting: Family

## 2013-03-01 ENCOUNTER — Ambulatory Visit: Payer: Medicare Other | Admitting: Family Medicine

## 2013-03-01 VITALS — BP 144/80 | HR 89 | Temp 97.9°F | Wt 149.0 lb

## 2013-03-01 DIAGNOSIS — R0982 Postnasal drip: Secondary | ICD-10-CM

## 2013-03-01 DIAGNOSIS — J029 Acute pharyngitis, unspecified: Secondary | ICD-10-CM

## 2013-03-01 NOTE — Progress Notes (Signed)
Subjective:    Patient ID: [name], female    DOB: [birth year], 79 y.o.   MRN: [id number]  HPI 79 year old white female, nonsmoker, patient of Dr. Cato Mulligan is in today complains of sneezing, runny nose, sore throat x3 days. She's been taking over-the-counter Mucinex DM and uses saltwater gargles that helps some. She chronically takes prednisone 10 mg, 20 mg alternating for polymyalgia. Denies fever. Has been around her children's dogs over the past several days.   Review of Systems  Constitutional: Negative.  Negative for fever and fatigue.  HENT: Positive for congestion, sore throat, sneezing and postnasal drip. Negative for sinus pressure.   Respiratory: Positive for cough. Negative for chest tightness, shortness of breath and wheezing.   Cardiovascular: Negative.   Endocrine: Negative.   Musculoskeletal: Positive for arthralgias.  Skin: Negative.   Allergic/Immunologic: Positive for environmental allergies. Negative for food allergies.  Neurological: Negative.   Psychiatric/Behavioral: Negative.    Past Medical History  Diagnosis Date  . Allergy     rhinitis  . COPD (chronic obstructive pulmonary disease)     stage II Golds  . Hyperlipidemia   . Hypertension   . Osteoporosis   . Gout   . PMR (polymyalgia rheumatica)     cylic prednisone    History   Social History  . Marital Status: Widowed    Spouse Name: N/A    Number of Children: N/A  . Years of Education: N/A   Occupational History  . Not on file.   Social History Main Topics  . Smoking status: Former Smoker -- 0.80 packs/day for 26 years    Types: Cigarettes    Quit date: 08/03/1997  . Smokeless tobacco: Never Used  . Alcohol Use: 0.0 oz/week  . Drug Use: No  . Sexually Active: Not on file   Other Topics Concern  . Not on file   Social History Narrative  . No narrative on file    Past Surgical History  Procedure Laterality Date  . Dilation and curettage of uterus    . Fractured arm     surgical repair  . Thyroglossal duct cyst      Family History  Problem Relation Age of Onset  . Heart disease Mother   . Diabetes Mother   . Cancer Father     lung    Allergies  Allergen Reactions  . Aspirin     REACTION: reflux  . Avelox (Moxifloxacin Hcl In Nacl)     Tremors , shakes, nausea   . Colchicine     REACTION: myalgia    Current Outpatient Prescriptions on File Prior to Visit  Medication Sig Dispense Refill  . albuterol (PROVENTIL HFA;VENTOLIN HFA) 108 (90 BASE) MCG/ACT inhaler Inhale 2 puffs into the lungs every 6 (six) hours as needed for wheezing.  8.5 g  6  . allopurinol (ZYLOPRIM) 300 MG tablet TAKE 1 TABLET BY MOUTH DAILY  30 tablet  3  . BENICAR 40 MG tablet TAKE ONE TABLET BY MOUTH DAILY  30 tablet  1  . carvedilol (COREG) 12.5 MG tablet TAKE ONE TABLET BY MOUTH TWICE DAILY WITH MEALS  60 tablet  0  . fluticasone (FLONASE) 50 MCG/ACT nasal spray Place 2 sprays into the nose daily.  16 g  3  . Fluticasone-Salmeterol (ADVAIR DISKUS) 250-50 MCG/DOSE AEPB Inhale 1 puff into the lungs 2 (two) times daily.  60 each  11  . labetalol (NORMODYNE) 200 MG tablet Take 200 mg by mouth 2 (two) times daily.      Marland Kitchen  Multiple Vitamins-Minerals (CENTRUM SILVER PO) Take by mouth daily.        . predniSONE (DELTASONE) 20 MG tablet Alternate 1 tablet every other day with 1/2 tablet every other day for 10 days and then start 1/2 tablet daily.  30 tablet  0  . VITAMIN D, CHOLECALCIFEROL, PO Take 1,000 Units by mouth daily.         No current facility-administered medications on file prior to visit.    BP 144/80  Pulse 89  Temp(Src) 97.9 F (36.6 C) (Oral)  Wt 149 lb (67.586 kg)  BMI 30.08 kg/m2  SpO2 98%chart    Objective:   Physical Exam  Constitutional: She is oriented to person, place, and time. She appears well-developed and well-nourished.  HENT:  Right Ear: External ear normal.  Left Ear: External ear normal.  Nose: Nose normal.  Mouth/Throat: Oropharynx is  clear and moist.  Neck: Normal range of motion. Neck supple.  Cardiovascular: Normal rate, regular rhythm and normal heart sounds.   Pulmonary/Chest: Effort normal and breath sounds normal.  Neurological: She is alert and oriented to person, place, and time.  Skin: Skin is warm and dry.  Psychiatric: She has a normal mood and affect.          Assessment & Plan:  Assessment: 1. Acute pharyngitis 2. Postnasal drip  Plan: Increase prednisone to 20 mg once daily x1 week only to help pharyngitis. Flonase 2 sprays in each nostril once a day. Patient to call the office with any questions or concerns. Recheck as scheduled, and as needed.

## 2013-03-03 ENCOUNTER — Telehealth: Payer: Self-pay | Admitting: Internal Medicine

## 2013-03-03 ENCOUNTER — Other Ambulatory Visit: Payer: Self-pay | Admitting: Internal Medicine

## 2013-03-03 NOTE — Telephone Encounter (Signed)
Patient Information:  Caller Name: Alicianna  Phone: [phone]  Patient: [name], [name]  Gender: Female  DOB: 12-29-1919  Age: 79 Years  PCP: Birdie Sons (Adults only)  Office Follow Up:  Does the office need to follow up with this patient?: No  Instructions For The Office: N/A  RN Note:  Pt wanting to know if if Ear can hurt w/ Sinus infections. Pt has a new onset of slight Left Ear Pain on 8-1, evaluated on 7-30 for Pharyngitis.  Discussed how Sinus and Ear Canal are connected and may well be painful.  Triage offered.  Pt would like to call back if symptoms worsen.  Advised Pt of [facility] weekend hours.  Pt verbalized understanding.  Symptoms  Reason For Call & Symptoms: Sinus Pressure w/ Left Ear Pain  Reviewed Health History In EMR: Yes  Reviewed Medications In EMR: Yes  Reviewed Allergies In EMR: Yes  Reviewed Surgeries / Procedures: Yes  Date of Onset of Symptoms: 03/02/2013  Treatments Tried: Tea w/ honey, Mucinex and Acetaminophen  Treatments Tried Worked: No  Guideline(s) Used:  No Protocol Available - Information Only  Disposition Per Guideline:   Home Care  Reason For Disposition Reached:   Information only question and nurse able to answer  Advice Given:  N/A  Patient Will Follow Care Advice:  YES

## 2013-03-06 ENCOUNTER — Telehealth: Payer: Self-pay | Admitting: Internal Medicine

## 2013-03-06 ENCOUNTER — Other Ambulatory Visit: Payer: Self-pay | Admitting: Critical Care Medicine

## 2013-03-06 ENCOUNTER — Other Ambulatory Visit: Payer: Self-pay | Admitting: Internal Medicine

## 2013-03-06 MED ORDER — AMOXICILLIN 500 MG PO TABS: 1000.0000 mg | ORAL_TABLET | Freq: Two times a day (BID) | ORAL | Status: DC

## 2013-03-06 NOTE — Telephone Encounter (Signed)
PT states that she is still experiencing symptoms. Her left ear is hurting worse than it has been, along with the left side of her face/neck. She also states that her throat is still hurting. Please assist.

## 2013-03-06 NOTE — Telephone Encounter (Signed)
Pt aware and verbalized understanding.  

## 2013-03-06 NOTE — Telephone Encounter (Signed)
I will send in abx. But she needs to take Zyrtec daily.

## 2013-03-06 NOTE — Telephone Encounter (Signed)
Please advise. Does pt need to be seen again?

## 2013-03-08 ENCOUNTER — Other Ambulatory Visit: Payer: Self-pay | Admitting: Critical Care Medicine

## 2013-03-09 NOTE — Telephone Encounter (Signed)
Last OV with PW: 02/12/12 Was seen by TP on 06/03/12. Has no pending OV with PW. Called, spoke with pt.  We have scheduled her to see PW on Sept 3 at 10:45 am.  Advair rx sent to last until OV.  Pt aware.

## 2013-03-16 ENCOUNTER — Other Ambulatory Visit: Payer: Self-pay | Admitting: Internal Medicine

## 2013-03-29 ENCOUNTER — Ambulatory Visit (INDEPENDENT_AMBULATORY_CARE_PROVIDER_SITE_OTHER): Payer: Medicare Other | Admitting: Internal Medicine

## 2013-03-29 ENCOUNTER — Encounter: Payer: Self-pay | Admitting: Internal Medicine

## 2013-03-29 VITALS — BP 142/76 | Temp 97.9°F | Wt 142.0 lb

## 2013-03-29 DIAGNOSIS — Z23 Encounter for immunization: Secondary | ICD-10-CM

## 2013-03-29 DIAGNOSIS — M353 Polymyalgia rheumatica: Secondary | ICD-10-CM

## 2013-03-29 NOTE — Progress Notes (Signed)
PMR- Pt now down to prednisone 2.5 mg po qd. She is feeling well, no complaints  Reviewed notes from padonda 03/01/13- sxs are ok  Reviewed meds and pmh  Ros- unremarkable Exam_ reivewed vitals Chest cta No tenosynovitis

## 2013-03-29 NOTE — Assessment & Plan Note (Signed)
Much improved- stay on prednisone 2.5 mg po qd See me 3 months

## 2013-04-05 ENCOUNTER — Encounter: Payer: Self-pay | Admitting: Critical Care Medicine

## 2013-04-05 ENCOUNTER — Ambulatory Visit (INDEPENDENT_AMBULATORY_CARE_PROVIDER_SITE_OTHER): Payer: Medicare Other | Admitting: Critical Care Medicine

## 2013-04-05 VITALS — BP 126/78 | HR 86 | Temp 97.8°F | Ht 59.0 in | Wt 148.0 lb

## 2013-04-05 DIAGNOSIS — J4489 Other specified chronic obstructive pulmonary disease: Secondary | ICD-10-CM

## 2013-04-05 DIAGNOSIS — J449 Chronic obstructive pulmonary disease, unspecified: Secondary | ICD-10-CM

## 2013-04-05 MED ORDER — FLUTICASONE-SALMETEROL 250-50 MCG/DOSE IN AEPB: INHALATION_SPRAY | RESPIRATORY_TRACT | Status: DC

## 2013-04-05 NOTE — Progress Notes (Signed)
Subjective:    Patient ID: [name], female    DOB: 04-Jul-1920, 79 y.o.   MRN: [id number]  HPI  79 y.o.  WF Copd  04/05/2013 Chief Complaint  Patient presents with  . Yearly follow up    Breathing isn't as good when in the heat.  Has chest tightness in the heat and DOE.  Very little wheezing and very little coughing.    Now min cough. No wheezing.  Dyspnea only with vacuuming.  No qhs dyspnea.   Pt had a sinus infection and rx pred/abx, one month ago, rx per pcp.  Heat is an issue.  Pt on low dose pred for gouty arthritis.  PCP trying lower dose on pred.   Past Medical History  Diagnosis Date  . Allergy     rhinitis  . COPD (chronic obstructive pulmonary disease)     stage II Golds  . Hyperlipidemia   . Hypertension   . Osteoporosis   . Gout   . PMR (polymyalgia rheumatica)     cylic prednisone     Family History  Problem Relation Age of Onset  . Heart disease Mother   . Diabetes Mother   . Cancer Father     lung     History   Social History  . Marital Status: Widowed    Spouse Name: N/A    Number of Children: N/A  . Years of Education: N/A   Occupational History  . Not on file.   Social History Main Topics  . Smoking status: Former Smoker -- 0.80 packs/day for 26 years    Types: Cigarettes    Quit date: 08/03/1997  . Smokeless tobacco: Never Used  . Alcohol Use: 0.0 oz/week  . Drug Use: No  . Sexual Activity: Not on file   Other Topics Concern  . Not on file   Social History Narrative  . No narrative on file     Allergies  Allergen Reactions  . Aspirin     REACTION: reflux  . Avelox [Moxifloxacin Hcl In Nacl]     Tremors , shakes, nausea   . Colchicine     REACTION: myalgia     Outpatient Prescriptions Prior to Visit  Medication Sig Dispense Refill  . albuterol (PROVENTIL HFA;VENTOLIN HFA) 108 (90 BASE) MCG/ACT inhaler Inhale 2 puffs into the lungs every 6 (six) hours as needed for wheezing.  8.5 g  6  . allopurinol (ZYLOPRIM) 300  MG tablet TAKE 1 TABLET BY MOUTH DAILY  30 tablet  2  . BENICAR 40 MG tablet TAKE ONE TABLET BY MOUTH DAILY  30 tablet  0  . carvedilol (COREG) 12.5 MG tablet TAKE ONE TABLET BY MOUTH TWICE DAILY WITH MEALS  60 tablet  1  . labetalol (NORMODYNE) 200 MG tablet Take 200 mg by mouth 2 (two) times daily.      . Multiple Vitamins-Minerals (CENTRUM SILVER PO) Take by mouth daily.        . predniSONE (DELTASONE) 2.5 MG tablet Take 2.5 mg by mouth every other day.       Marland Kitchen VITAMIN D, CHOLECALCIFEROL, PO Take 1,000 Units by mouth daily.        Marland Kitchen ADVAIR DISKUS 250-50 MCG/DOSE AEPB INHALE 1 PUFF INTO THE LUNGS 2 (TWO) TIMES DAILY.  60 each  0  . fluticasone (FLONASE) 50 MCG/ACT nasal spray Place 2 sprays into the nose daily.  16 g  3   No facility-administered medications prior to visit.  Review of Systems  Constitutional:   No  weight loss, night sweats,  Fevers, chills, fatigue, lassitude. HEENT:   No headaches,  Difficulty swallowing,  Tooth/dental problems,  Sore throat,                No sneezing, itching, ear ache,  +nasal congestion, post nasal drip,   CV:  No chest pain,  Orthopnea, PND, notes mild swelling in lower extremities, no anasarca, dizziness, palpitations  GI  No heartburn, indigestion, abdominal pain, nausea, vomiting, diarrhea, change in bowel habits, loss of appetite  Resp:   No coughing up of blood.  No change in color of mucus.  No wheezing.  No chest wall deformity  Skin: no rash or lesions.  GU: no dysuria, change in color of urine, no urgency or frequency.  No flank pain.  MS:  No joint pain or swelling.  No decreased range of motion.  No back pain.  Psych:  No change in mood or affect. No depression or anxiety.  No memory loss.     Objective:   Physical Exam  Filed Vitals:   04/05/13 1045  BP: 126/78  Pulse: 86  Temp: 97.8 F (36.6 C)  TempSrc: Oral  Height: 4\' 11"  (1.499 m)  Weight: 148 lb (67.132 kg)  SpO2: 97%    Gen: Pleasant, well-nourished,  in no distress,  normal affect  ENT: No lesions,  mouth clear,  oropharynx clear, no postnasal drip  Neck: No JVD, no TMG, no carotid bruits  Lungs: No use of accessory muscles, no dullness to percussion, distant BS  Cardiovascular: RRR, heart sounds normal, no murmur or gallops, no peripheral edema  Abdomen: soft and NT, no HSM,  BS normal  Musculoskeletal: No deformities, no cyanosis or clubbing  Neuro: alert, non focal  Skin: Warm, no lesions or rashes       PFT Conversion 12/20/2009  FVC 1.71  FVC PREDICT   FVC  % Predicted 102.4  FEV1 1.21  FEV1 PREDICT 1.20  FEV % Predicted 100.3  FEV1/FVC 70.8  FEV1/FVC PRE   FEV1/FVC%EXP 97.60  FeF 25-75   FeF 25-75 % Predicted   FEF % EXPEC   PEF % EXPECT 3.29    Assessment & Plan:   COPD Gold stage B. COPD stable at this time Plan Maintain inhaled medications as prescribed    Updated Medication List Outpatient Encounter Prescriptions as of 04/05/2013  Medication Sig Dispense Refill  . albuterol (PROVENTIL HFA;VENTOLIN HFA) 108 (90 BASE) MCG/ACT inhaler Inhale 2 puffs into the lungs every 6 (six) hours as needed for wheezing.  8.5 g  6  . allopurinol (ZYLOPRIM) 300 MG tablet TAKE 1 TABLET BY MOUTH DAILY  30 tablet  2  . BENICAR 40 MG tablet TAKE ONE TABLET BY MOUTH DAILY  30 tablet  0  . carvedilol (COREG) 12.5 MG tablet TAKE ONE TABLET BY MOUTH TWICE DAILY WITH MEALS  60 tablet  1  . fluticasone (FLONASE) 50 MCG/ACT nasal spray Place 2 sprays into the nose daily as needed.      . Fluticasone-Salmeterol (ADVAIR DISKUS) 250-50 MCG/DOSE AEPB INHALE 1 PUFF INTO THE LUNGS 2 (TWO) TIMES DAILY.  60 each  11  . labetalol (NORMODYNE) 200 MG tablet Take 200 mg by mouth 2 (two) times daily.      . Multiple Vitamins-Minerals (CENTRUM SILVER PO) Take by mouth daily.        . predniSONE (DELTASONE) 2.5 MG tablet Take 2.5 mg by mouth every other  day.       Marland Kitchen VITAMIN D, CHOLECALCIFEROL, PO Take 1,000 Units by mouth daily.        .  [DISCONTINUED] ADVAIR DISKUS 250-50 MCG/DOSE AEPB INHALE 1 PUFF INTO THE LUNGS 2 (TWO) TIMES DAILY.  60 each  0  . [DISCONTINUED] fluticasone (FLONASE) 50 MCG/ACT nasal spray Place 2 sprays into the nose daily.  16 g  3   No facility-administered encounter medications on file as of 04/05/2013.

## 2013-04-05 NOTE — Patient Instructions (Addendum)
No change in inhaled or maintenance medications. Return in  6 months 

## 2013-04-05 NOTE — Assessment & Plan Note (Addendum)
Gold stage B. COPD stable at this time Plan Maintain inhaled medications as prescribed  

## 2013-04-17 ENCOUNTER — Other Ambulatory Visit: Payer: Self-pay | Admitting: Internal Medicine

## 2013-05-07 ENCOUNTER — Other Ambulatory Visit: Payer: Self-pay | Admitting: Internal Medicine

## 2013-05-12 ENCOUNTER — Ambulatory Visit (INDEPENDENT_AMBULATORY_CARE_PROVIDER_SITE_OTHER): Payer: Medicare Other | Admitting: Internal Medicine

## 2013-05-12 ENCOUNTER — Encounter: Payer: Self-pay | Admitting: Internal Medicine

## 2013-05-12 VITALS — BP 140/80 | Temp 97.9°F | Wt 146.0 lb

## 2013-05-12 DIAGNOSIS — M353 Polymyalgia rheumatica: Secondary | ICD-10-CM

## 2013-05-12 MED ORDER — PREDNISONE 5 MG PO TABS: 5.0000 mg | ORAL_TABLET | Freq: Every day | ORAL | Status: DC

## 2013-05-12 NOTE — Assessment & Plan Note (Signed)
Increase prednisone 5 mg po qd

## 2013-05-12 NOTE — Progress Notes (Signed)
pmr-  Decreased dose of prednisone-she has become "more achey.  Reviewed previous notes Reviewed meds and hx of prednisone  Reviewed vitals  nad , chest cta No joint swelling/effusion

## 2013-05-17 ENCOUNTER — Telehealth: Payer: Self-pay | Admitting: Critical Care Medicine

## 2013-05-17 NOTE — Telephone Encounter (Signed)
Handicap placard placed in mail to pt's verified home address.  Pt aware.

## 2013-05-17 NOTE — Telephone Encounter (Signed)
This pt needs a handicap form sent to her  i will sign

## 2013-06-05 ENCOUNTER — Other Ambulatory Visit: Payer: Self-pay | Admitting: Internal Medicine

## 2013-06-07 ENCOUNTER — Ambulatory Visit (INDEPENDENT_AMBULATORY_CARE_PROVIDER_SITE_OTHER): Payer: Medicare Other | Admitting: Internal Medicine

## 2013-06-07 ENCOUNTER — Encounter: Payer: Self-pay | Admitting: Internal Medicine

## 2013-06-07 VITALS — BP 140/80 | HR 92 | Temp 97.7°F | Wt 146.0 lb

## 2013-06-07 DIAGNOSIS — M353 Polymyalgia rheumatica: Secondary | ICD-10-CM

## 2013-06-07 NOTE — Progress Notes (Signed)
PMR- much better on prednisone 5mg  (increased approximately 3 weeks ago).  She is otherwise doing well.  Reviewed pmh, meds, shx,   Ros- occasional wheeze  Exam- reviewed vitals Chest cta No tenosynovitis

## 2013-06-07 NOTE — Progress Notes (Signed)
Pre-visit discussion using our clinic review tool. No additional management support is needed unless otherwise documented below in the visit note.  

## 2013-06-08 NOTE — Assessment & Plan Note (Signed)
Clinically improved i'm going to leave on low dose prednisone for the next two months and then i'll try to taper

## 2013-08-07 ENCOUNTER — Ambulatory Visit (INDEPENDENT_AMBULATORY_CARE_PROVIDER_SITE_OTHER): Payer: Medicare Other | Admitting: Internal Medicine

## 2013-08-07 ENCOUNTER — Encounter: Payer: Self-pay | Admitting: Internal Medicine

## 2013-08-07 VITALS — BP 135/90 | HR 92 | Temp 97.4°F | Ht 59.0 in | Wt 150.0 lb

## 2013-08-07 DIAGNOSIS — M109 Gout, unspecified: Secondary | ICD-10-CM

## 2013-08-07 DIAGNOSIS — I10 Essential (primary) hypertension: Secondary | ICD-10-CM

## 2013-08-07 DIAGNOSIS — M810 Age-related osteoporosis without current pathological fracture: Secondary | ICD-10-CM

## 2013-08-07 DIAGNOSIS — M353 Polymyalgia rheumatica: Secondary | ICD-10-CM

## 2013-08-07 NOTE — Assessment & Plan Note (Signed)
doin greasonably well Continue predniosne at current low dose (2.5 mg po qd)

## 2013-08-07 NOTE — Assessment & Plan Note (Signed)
BP Readings from Last 3 Encounters:  08/07/13 162/82  06/07/13 140/80  05/12/13 140/80

## 2013-08-07 NOTE — Progress Notes (Signed)
Pre visit review using our clinic review tool, if applicable. No additional management support is needed unless otherwise documented below in the visit note. 

## 2013-08-07 NOTE — Progress Notes (Signed)
She reports a fall on 12/25. Has had some left sided rib pain (she thinks is getting better)  PMR - well managed with 2.5 mg po qd.   Hx of COPD- no wheezing. She reports a "cold" a few weeks ago. sxs have resolved  Past Medical History  Diagnosis Date  . Allergy     rhinitis  . COPD (chronic obstructive pulmonary disease)     stage II Golds  . Hyperlipidemia   . Hypertension   . Osteoporosis   . Gout   . PMR (polymyalgia rheumatica)     cylic prednisone    History   Social History  . Marital Status: Widowed    Spouse Name: N/A    Number of Children: N/A  . Years of Education: N/A   Occupational History  . Not on file.   Social History Main Topics  . Smoking status: Former Smoker -- 0.80 packs/day for 26 years    Types: Cigarettes    Quit date: 08/03/1997  . Smokeless tobacco: Never Used  . Alcohol Use: 0.0 oz/week  . Drug Use: No  . Sexual Activity: Not on file   Other Topics Concern  . Not on file   Social History Narrative  . No narrative on file    Past Surgical History  Procedure Laterality Date  . Dilation and curettage of uterus    . Fractured arm      surgical repair  . Thyroglossal duct cyst      Family History  Problem Relation Age of Onset  . Heart disease Mother   . Diabetes Mother   . Cancer Father     lung    Allergies  Allergen Reactions  . Aspirin     REACTION: reflux  . Avelox [Moxifloxacin Hcl In Nacl]     Tremors , shakes, nausea   . Colchicine     REACTION: myalgia    Current Outpatient Prescriptions on File Prior to Visit  Medication Sig Dispense Refill  . albuterol (PROVENTIL HFA;VENTOLIN HFA) 108 (90 BASE) MCG/ACT inhaler Inhale 2 puffs into the lungs every 6 (six) hours as needed for wheezing.  8.5 g  6  . allopurinol (ZYLOPRIM) 300 MG tablet TAKE 1 TABLET BY MOUTH DAILY  30 tablet  1  . BENICAR 40 MG tablet TAKE ONE TABLET BY MOUTH DAILY  30 tablet  3  . carvedilol (COREG) 12.5 MG tablet TAKE ONE TABLET BY MOUTH  TWICE DAILY WITH MEALS  60 tablet  2  . fluticasone (FLONASE) 50 MCG/ACT nasal spray Place 2 sprays into the nose daily as needed.      . Fluticasone-Salmeterol (ADVAIR DISKUS) 250-50 MCG/DOSE AEPB INHALE 1 PUFF INTO THE LUNGS 2 (TWO) TIMES DAILY.  60 each  11  . labetalol (NORMODYNE) 200 MG tablet Take 200 mg by mouth 2 (two) times daily.      . Multiple Vitamins-Minerals (CENTRUM SILVER PO) Take by mouth daily.        Marland Kitchen VITAMIN D, CHOLECALCIFEROL, PO Take 1,000 Units by mouth daily.         No current facility-administered medications on file prior to visit.     patient denies chest pain, shortness of breath, orthopnea. Denies lower extremity edema, abdominal pain, change in appetite, change in bowel movements. Patient denies rashes, musculoskeletal complaints. No other specific complaints in a complete review of systems.   BP 135/90  Pulse 92  Temp(Src) 97.4 F (36.3 C) (Oral)  Ht 4\' 11"  (1.499 m)  Wt 150 lb (68.04 kg)  BMI 30.28 kg/m2  Well-developed well-nourished female in no acute distress. HEENT exam atraumatic, normocephalic, extraocular muscles are intact. Neck is supple. No jugular venous distention no thyromegaly. Chest clear to auscultation without increased work of breathing. Cardiac exam S1 and S2 are regular. Abdominal exam active bowel sounds, soft, nontender. Extremities no edema. Neurologic exam she is alert without any motor sensory deficits. Gait is normal.   OSTEOPOROSIS She had a recent fall- no obvious fracutre Discussed fall precautions  Polymyalgia rheumatica doin greasonably well Continue predniosne at current low dose (2.5 mg po qd)  GOUT No recurrence

## 2013-08-07 NOTE — Assessment & Plan Note (Signed)
No recurrence. 

## 2013-08-07 NOTE — Assessment & Plan Note (Signed)
She had a recent fall- no obvious fracutre Discussed fall precautions

## 2013-08-23 ENCOUNTER — Other Ambulatory Visit: Payer: Self-pay | Admitting: Internal Medicine

## 2013-08-30 ENCOUNTER — Ambulatory Visit (INDEPENDENT_AMBULATORY_CARE_PROVIDER_SITE_OTHER): Payer: Medicare Other | Admitting: Family

## 2013-08-30 ENCOUNTER — Encounter: Payer: Self-pay | Admitting: Family

## 2013-08-30 VITALS — BP 192/80 | HR 89 | Wt 148.0 lb

## 2013-08-30 DIAGNOSIS — S29012A Strain of muscle and tendon of back wall of thorax, initial encounter: Secondary | ICD-10-CM

## 2013-08-30 DIAGNOSIS — S239XXA Sprain of unspecified parts of thorax, initial encounter: Secondary | ICD-10-CM

## 2013-08-30 DIAGNOSIS — M353 Polymyalgia rheumatica: Secondary | ICD-10-CM

## 2013-08-30 MED ORDER — METHYLPREDNISOLONE 4 MG PO KIT: PACK | ORAL | Status: AC

## 2013-08-30 NOTE — Progress Notes (Signed)
Pre visit review using our clinic review tool, if applicable. No additional management support is needed unless otherwise documented below in the visit note. 

## 2013-08-30 NOTE — Progress Notes (Signed)
Subjective:    Patient ID: [name], female    DOB: 20-Feb-1920, 80 y.o.   MRN: [id number]  HPI  80 year old WF, nonsmoker, is in today with c/o right upper back pain after moving some files on Monday. Now has pain with movement, 7/10, has been taking Advil that helps some. Has a history of Polymyalgia Rheumatica.    Review of Systems  Constitutional: Negative.   Respiratory: Negative.   Cardiovascular: Negative.   Endocrine: Negative.   Musculoskeletal: Positive for myalgias.       Right back pain   Skin: Negative.   Neurological: Negative.   Psychiatric/Behavioral: Negative.    Past Medical History  Diagnosis Date  . Allergy     rhinitis  . COPD (chronic obstructive pulmonary disease)     stage II Golds  . Hyperlipidemia   . Hypertension   . Osteoporosis   . Gout   . PMR (polymyalgia rheumatica)     cylic prednisone    History   Social History  . Marital Status: Widowed    Spouse Name: N/A    Number of Children: N/A  . Years of Education: N/A   Occupational History  . Not on file.   Social History Main Topics  . Smoking status: Former Smoker -- 0.80 packs/day for 26 years    Types: Cigarettes    Quit date: 08/03/1997  . Smokeless tobacco: Never Used  . Alcohol Use: 0.0 oz/week  . Drug Use: No  . Sexual Activity: Not on file   Other Topics Concern  . Not on file   Social History Narrative  . No narrative on file    Past Surgical History  Procedure Laterality Date  . Dilation and curettage of uterus    . Fractured arm      surgical repair  . Thyroglossal duct cyst      Family History  Problem Relation Age of Onset  . Heart disease Mother   . Diabetes Mother   . Cancer Father     lung    Allergies  Allergen Reactions  . Aspirin     REACTION: reflux  . Avelox [Moxifloxacin Hcl In Nacl]     Tremors , shakes, nausea   . Colchicine     REACTION: myalgia    Current Outpatient Prescriptions on File Prior to Visit  Medication Sig  Dispense Refill  . albuterol (PROVENTIL HFA;VENTOLIN HFA) 108 (90 BASE) MCG/ACT inhaler Inhale 2 puffs into the lungs every 6 (six) hours as needed for wheezing.  8.5 g  6  . allopurinol (ZYLOPRIM) 300 MG tablet TAKE 1 TABLET BY MOUTH DAILY  30 tablet  0  . BENICAR 40 MG tablet TAKE ONE TABLET BY MOUTH DAILY  30 tablet  3  . carvedilol (COREG) 12.5 MG tablet TAKE ONE TABLET BY MOUTH TWICE DAILY WITH MEALS  60 tablet  2  . fluticasone (FLONASE) 50 MCG/ACT nasal spray Place 2 sprays into the nose daily as needed.      . Fluticasone-Salmeterol (ADVAIR DISKUS) 250-50 MCG/DOSE AEPB INHALE 1 PUFF INTO THE LUNGS 2 (TWO) TIMES DAILY.  60 each  11  . labetalol (NORMODYNE) 200 MG tablet Take 200 mg by mouth 2 (two) times daily.      . Multiple Vitamins-Minerals (CENTRUM SILVER PO) Take by mouth daily.        . predniSONE (DELTASONE) 2.5 MG tablet Take 2.5 mg by mouth daily with breakfast.      . VITAMIN D,  CHOLECALCIFEROL, PO Take 1,000 Units by mouth daily.         No current facility-administered medications on file prior to visit.    BP 192/80  Pulse 89  Wt 148 lb (67.132 kg)chart    Objective:   Physical Exam  Constitutional: She is oriented to person, place, and time. She appears well-developed and well-nourished.  Neck: Normal range of motion. Neck supple.  Cardiovascular: Normal rate, regular rhythm and normal heart sounds.   Pulmonary/Chest: Effort normal and breath sounds normal.  Musculoskeletal: She exhibits tenderness.  Tenderness to palpation of the right upper back. No tenderness to palpation of the spine.   Neurological: She is alert and oriented to person, place, and time.  Skin: Skin is warm and dry.  Psychiatric: She has a normal mood and affect.          Assessment & Plan:  Assessment: 1. Back Pain-Muscle Strain 2. Polymyalgia Rheumatica  Plan: Medrol dosepak as directed. Hold daily dose or prednisone until complete.

## 2013-08-30 NOTE — Patient Instructions (Signed)

## 2013-09-05 ENCOUNTER — Telehealth: Payer: Self-pay | Admitting: Internal Medicine

## 2013-09-05 NOTE — Telephone Encounter (Signed)
Patient Information:  Caller Name: [name]  Phone: [phone]  Patient: [name], [name]  Gender: Female  DOB: 07/31/1920  Age: 80 Years  PCP: Birdie SonsSwords, Bruce (Adults only)  Office Follow Up:  Does the office need to follow up with this patient?: No  Instructions For The Office: N/A  RN Note:  Pt is requesting any recommendations that PCP may have to help with Pain.  Call back information given  Symptoms  Reason For Call & Symptoms: Pt is calling about a fall around Christmas.  Pt carried a heavy bag and has reinjured back and ribs.  Last pm the back pain was worse.  Pt has been taking advil for pain and has completed the steriods.  Pt reports that pain is very limiting her activities and is looking for additional treatments that may help with pain.  Pt has been using hot and cold therapy.  Pt is most comfortable sitting upright in chair with small cushion supporting back.  Walking does not make worse.  Normal activities (bathroom, cooking, light cleaning) make sxs worse.  Reviewed Health History In EMR: Yes  Reviewed Medications In EMR: Yes  Reviewed Allergies In EMR: Yes  Reviewed Surgeries / Procedures: Yes  Date of Onset of Symptoms: 07/26/2013  Guideline(s) Used:  Back Injury  Disposition Per Guideline:   See Within 3 Days in Office  Reason For Disposition Reached:   Injury is still painful or swollen after 2 weeks  Advice Given:  Call Back If:  You become worse.  RN Overrode Recommendation:  Patient Requests Prescription  Pt request PCP to review and advise any additional treatment

## 2013-09-08 ENCOUNTER — Ambulatory Visit (INDEPENDENT_AMBULATORY_CARE_PROVIDER_SITE_OTHER): Payer: Medicare Other | Admitting: Family

## 2013-09-08 ENCOUNTER — Ambulatory Visit (INDEPENDENT_AMBULATORY_CARE_PROVIDER_SITE_OTHER)
Admission: RE | Admit: 2013-09-08 | Discharge: 2013-09-08 | Disposition: A | Discharge status: Home / Self Care | Payer: Medicare Other | Source: Ambulatory Visit | Attending: Family | Admitting: Family

## 2013-09-08 ENCOUNTER — Encounter: Payer: Self-pay | Admitting: Family

## 2013-09-08 VITALS — BP 162/92 | HR 71 | Ht 59.0 in | Wt 145.0 lb

## 2013-09-08 DIAGNOSIS — M546 Pain in thoracic spine: Secondary | ICD-10-CM

## 2013-09-08 MED ORDER — LIDOCAINE 5 % EX PTCH: 1.0000 | MEDICATED_PATCH | CUTANEOUS | Status: DC

## 2013-09-08 MED ORDER — TRAMADOL HCL 50 MG PO TABS: 50.0000 mg | ORAL_TABLET | Freq: Three times a day (TID) | ORAL | Status: DC | PRN

## 2013-09-08 NOTE — Progress Notes (Signed)
Subjective:    Patient ID: [name], female    DOB: 12/03/1919, 80 y.o.   MRN: [id number]  Back Pain   80 year old white female, nonsmoker is in today with persistent low back pain since December 2014. The pain occurred shortly after a fall and has not resolved. She rates the pain a 9/10, worse with movement. Has been taking Advil and , applying ice and heat that has not helped.She was seen last week and was prescribed a Medrol Dosepak that did not help. Describes the pain as stabbing and aching. Unable to sleep at night.   Review of Systems  Constitutional: Negative.   Respiratory: Negative.   Cardiovascular: Negative.   Gastrointestinal: Negative.   Genitourinary: Negative.   Musculoskeletal: Positive for back pain.  Skin: Negative.   Neurological: Negative.   Hematological: Negative.   Psychiatric/Behavioral: Negative.    Past Medical History  Diagnosis Date  . Allergy     rhinitis  . COPD (chronic obstructive pulmonary disease)     stage II Golds  . Hyperlipidemia   . Hypertension   . Osteoporosis   . Gout   . PMR (polymyalgia rheumatica)     cylic prednisone    History   Social History  . Marital Status: Widowed    Spouse Name: N/A    Number of Children: N/A  . Years of Education: N/A   Occupational History  . Not on file.   Social History Main Topics  . Smoking status: Former Smoker -- 0.80 packs/day for 26 years    Types: Cigarettes    Quit date: 08/03/1997  . Smokeless tobacco: Never Used  . Alcohol Use: 0.0 oz/week  . Drug Use: No  . Sexual Activity: Not on file   Other Topics Concern  . Not on file   Social History Narrative  . No narrative on file    Past Surgical History  Procedure Laterality Date  . Dilation and curettage of uterus    . Fractured arm      surgical repair  . Thyroglossal duct cyst      Family History  Problem Relation Age of Onset  . Heart disease Mother   . Diabetes Mother   . Cancer Father     lung     Allergies  Allergen Reactions  . Aspirin     REACTION: reflux  . Avelox [Moxifloxacin Hcl In Nacl]     Tremors , shakes, nausea   . Colchicine     REACTION: myalgia    Current Outpatient Prescriptions on File Prior to Visit  Medication Sig Dispense Refill  . albuterol (PROVENTIL HFA;VENTOLIN HFA) 108 (90 BASE) MCG/ACT inhaler Inhale 2 puffs into the lungs every 6 (six) hours as needed for wheezing.  8.5 g  6  . allopurinol (ZYLOPRIM) 300 MG tablet TAKE 1 TABLET BY MOUTH DAILY  30 tablet  0  . BENICAR 40 MG tablet TAKE ONE TABLET BY MOUTH DAILY  30 tablet  3  . carvedilol (COREG) 12.5 MG tablet TAKE ONE TABLET BY MOUTH TWICE DAILY WITH MEALS  60 tablet  2  . fluticasone (FLONASE) 50 MCG/ACT nasal spray Place 2 sprays into the nose daily as needed.      . Fluticasone-Salmeterol (ADVAIR DISKUS) 250-50 MCG/DOSE AEPB INHALE 1 PUFF INTO THE LUNGS 2 (TWO) TIMES DAILY.  60 each  11  . labetalol (NORMODYNE) 200 MG tablet Take 200 mg by mouth 2 (two) times daily.      . Multiple Vitamins-Minerals (  CENTRUM SILVER PO) Take by mouth daily.        . predniSONE (DELTASONE) 2.5 MG tablet Take 2.5 mg by mouth daily with breakfast.      . VITAMIN D, CHOLECALCIFEROL, PO Take 1,000 Units by mouth daily.         No current facility-administered medications on file prior to visit.    BP 162/92  Pulse 71  Ht 4\' 11"  (1.499 m)  Wt 145 lb (65.772 kg)  BMI 29.27 kg/m2chart    Objective:   Physical Exam  Constitutional: She is oriented to person, place, and time. She appears well-developed and well-nourished.  Neck: Neck supple.  Cardiovascular: Normal rate, regular rhythm and normal heart sounds.   Pulmonary/Chest: Effort normal and breath sounds normal.  Musculoskeletal: She exhibits tenderness.  Thoracic back pain to palpation. No bruising. Kyphosis noted. No other abnormality.   Neurological: She is alert and oriented to person, place, and time.  Skin: Skin is warm and dry.  Psychiatric: She  has a normal mood and affect.          Assessment & Plan:   [name] was seen today for back pain.  Diagnoses and associated orders for this visit:  Thoracic back pain - DG Thoracic Spine W/Swimmers; Future  Other Orders - traMADol (ULTRAM) 50 MG tablet; Take 1 tablet (50 mg total) by mouth every 8 (eight) hours as needed. - lidocaine (LIDODERM) 5 %; Place 1 patch onto the skin daily. Remove & Discard patch within 12 hours or as directed by MD     Thoracic back pain, rule out fracture. Tramadol as needed for pain.

## 2013-09-08 NOTE — Progress Notes (Signed)
Pre visit review using our clinic review tool, if applicable. No additional management support is needed unless otherwise documented below in the visit note. 

## 2013-09-08 NOTE — Patient Instructions (Signed)
[address] for xray to rule out fracture.   Vertebral Fracture You have a fracture of one or more vertebra. These are the bony parts that form the spine. Minor vertebral fractures happen when people fall. Osteoporosis is associated with many of these fractures. Hospital care may not be necessary for minor compression fractures that are stable. However, multiple fractures of the spine or unstable injuries can cause severe pain and even damage the spinal cord. A spinal cord injury may cause paralysis, numbness, or loss of normal bowel and bladder control.  Normally there is pain and stiffness in the back for 3 to 6 weeks after a vertebral fracture. Bed rest for several days, pain medicine, and a slow return to activity is often the only treatment that is needed depending on the location of the fracture. Neck and back braces may be helpful in reducing pain and increasing mobility. When your pain allows, you should begin walking or swimming to help maintain your endurance. Exercises to improve motion and to strengthen the back may also be useful after the initial pain improves. Treatment for osteoporosis may be essential for full recovery. This will help reduce your risk of vertebral fractures with a future fall. During the first few days after a spine fracture you may feel nauseated or vomit. If this is severe, hospital care with IV fluids will be needed.  Arrange for follow-up care as recommended to assure proper long-term care and prevention of further spine injury.  SEEK IMMEDIATE MEDICAL CARE IF:  You have increasing pain, vomiting, or are unable to move around at all.  You develop numbness, tingling, weakness, or paralysis of any part of your body.  You develop a loss of normal bowel or bladder control.  You have difficulty breathing, cough, fever, chest or abdominal pain. MAKE SURE YOU:   Understand these instructions.  Will watch your condition.  Will get help right away if you are  not doing well or get worse. Document Released: 08/27/2004 Document Revised: 10/12/2011 Document Reviewed: 03/12/2009 [address] [facility] Patient Information 2014 [place], [place].

## 2013-09-10 ENCOUNTER — Other Ambulatory Visit: Payer: Self-pay | Admitting: Internal Medicine

## 2013-09-11 ENCOUNTER — Telehealth: Payer: Self-pay | Admitting: Internal Medicine

## 2013-09-11 MED ORDER — ALENDRONATE SODIUM 70 MG PO TABS: 70.0000 mg | ORAL_TABLET | ORAL | Status: DC

## 2013-09-11 NOTE — Telephone Encounter (Signed)
I received a fax denying Lidocaine patches for pt.  I will send you a copy of the denial letter to review the criteria.

## 2013-09-11 NOTE — Telephone Encounter (Signed)
Message copied by Beverely LowFRAZIER, Patsye Sullivant L on Mon Sep 11, 2013  9:26 AM ------      Message from: Adline MangoAMPBELL, PADONDA B      Created: Fri Sep 08, 2013  4:39 PM       Compression fracture related to fall. Continue pain medication. Consider fosamax to help strengthen bones. ------

## 2013-09-25 ENCOUNTER — Other Ambulatory Visit: Payer: Self-pay | Admitting: Internal Medicine

## 2013-09-26 ENCOUNTER — Other Ambulatory Visit: Payer: Self-pay | Admitting: Internal Medicine

## 2013-09-27 ENCOUNTER — Other Ambulatory Visit: Payer: Self-pay | Admitting: Internal Medicine

## 2013-10-06 ENCOUNTER — Ambulatory Visit: Payer: Medicare Other | Admitting: Internal Medicine

## 2013-10-30 ENCOUNTER — Ambulatory Visit (INDEPENDENT_AMBULATORY_CARE_PROVIDER_SITE_OTHER): Payer: Medicare Other | Admitting: Critical Care Medicine

## 2013-10-30 ENCOUNTER — Encounter: Payer: Self-pay | Admitting: Critical Care Medicine

## 2013-10-30 VITALS — BP 166/100 | HR 88 | Ht <= 58 in | Wt 143.0 lb

## 2013-10-30 DIAGNOSIS — Z23 Encounter for immunization: Secondary | ICD-10-CM

## 2013-10-30 DIAGNOSIS — J449 Chronic obstructive pulmonary disease, unspecified: Secondary | ICD-10-CM

## 2013-10-30 NOTE — Progress Notes (Signed)
Subjective:    Patient ID: [name], female    DOB: 11-19-1919, 80 y.o.   MRN: [id number]  HPI  80 y.o.  WF Copd  10/30/2013 Chief Complaint  Patient presents with  . Follow-up    Pt c/o SOB with exertion.  pt fractured a vertebrae a couple of months ago, SOB has increased since then.  Pt denies any congestion, cough, wheezing.  CAT score 13.  Pt fell over christmas and bruised ribs.  Pt went to dumpster with garbage, was heavy.  T spine Fx.   Pt with DOE.  No cough or wheeze   Past Medical History  Diagnosis Date  . Allergy     rhinitis  . COPD (chronic obstructive pulmonary disease)     stage II Golds  . Hyperlipidemia   . Hypertension   . Osteoporosis   . Gout   . PMR (polymyalgia rheumatica)     cylic prednisone     Family History  Problem Relation Age of Onset  . Heart disease Mother   . Diabetes Mother   . Cancer Father     lung     History   Social History  . Marital Status: Widowed    Spouse Name: N/A    Number of Children: N/A  . Years of Education: N/A   Occupational History  . Not on file.   Social History Main Topics  . Smoking status: Former Smoker -- 0.80 packs/day for 26 years    Types: Cigarettes    Quit date: 08/03/1997  . Smokeless tobacco: Never Used  . Alcohol Use: 0.0 oz/week  . Drug Use: No  . Sexual Activity: Not on file   Other Topics Concern  . Not on file   Social History Narrative  . No narrative on file     Allergies  Allergen Reactions  . Aspirin     REACTION: reflux  . Avelox [Moxifloxacin Hcl In Nacl]     Tremors , shakes, nausea   . Colchicine     REACTION: myalgia     Outpatient Prescriptions Prior to Visit  Medication Sig Dispense Refill  . albuterol (PROVENTIL HFA;VENTOLIN HFA) 108 (90 BASE) MCG/ACT inhaler Inhale 2 puffs into the lungs every 6 (six) hours as needed for wheezing.  8.5 g  6  . allopurinol (ZYLOPRIM) 300 MG tablet TAKE 1 TABLET BY MOUTH DAILY  30 tablet  5  . BENICAR 40 MG tablet  TAKE ONE TABLET BY MOUTH DAILY  30 tablet  2  . carvedilol (COREG) 12.5 MG tablet TAKE ONE TABLET BY MOUTH TWICE DAILY WITH MEALS  60 tablet  1  . Fluticasone-Salmeterol (ADVAIR DISKUS) 250-50 MCG/DOSE AEPB INHALE 1 PUFF INTO THE LUNGS 2 (TWO) TIMES DAILY.  60 each  11  . Multiple Vitamins-Minerals (CENTRUM SILVER PO) Take by mouth daily.        . predniSONE (DELTASONE) 2.5 MG tablet Take 2.5 mg by mouth daily with breakfast.      . traMADol (ULTRAM) 50 MG tablet Take 1 tablet (50 mg total) by mouth every 8 (eight) hours as needed.  30 tablet  0  . VITAMIN D, CHOLECALCIFEROL, PO Take 1,000 Units by mouth daily.        Marland Kitchen alendronate (FOSAMAX) 70 MG tablet Take 1 tablet (70 mg total) by mouth every 7 (seven) days. Take with a full glass of water on an empty stomach.  12 tablet  1  . fluticasone (FLONASE) 50 MCG/ACT nasal spray Place 2  sprays into the nose daily as needed.      . labetalol (NORMODYNE) 200 MG tablet Take 200 mg by mouth 2 (two) times daily.      Marland Kitchen. lidocaine (LIDODERM) 5 % Place 1 patch onto the skin daily. Remove & Discard patch within 12 hours or as directed by MD  30 patch  0   No facility-administered medications prior to visit.     Review of Systems  Constitutional:   No  weight loss, night sweats,  Fevers, chills, fatigue, lassitude. HEENT:   No headaches,  Difficulty swallowing,  Tooth/dental problems,  Sore throat,                No sneezing, itching, ear ache,  +nasal congestion, post nasal drip,   CV:  No chest pain,  Orthopnea, PND, notes mild swelling in lower extremities, no anasarca, dizziness, palpitations  GI  No heartburn, indigestion, abdominal pain, nausea, vomiting, diarrhea, change in bowel habits, loss of appetite  Resp:   No coughing up of blood.  No change in color of mucus.  No wheezing.  No chest wall deformity  Skin: no rash or lesions.  GU: no dysuria, change in color of urine, no urgency or frequency.  No flank pain.  MS:  No joint pain or  swelling.  No decreased range of motion.  No back pain.  Psych:  No change in mood or affect. No depression or anxiety.  No memory loss.     Objective:   Physical Exam  Filed Vitals:   10/30/13 1357  BP: 166/100  Pulse: 88  Height: 4\' 10"  (1.473 m)  Weight: 143 lb (64.864 kg)  SpO2: 96%    Gen: Pleasant, well-nourished, in no distress,  normal affect  ENT: No lesions,  mouth clear,  oropharynx clear, no postnasal drip  Neck: No JVD, no TMG, no carotid bruits  Lungs: No use of accessory muscles, no dullness to percussion, distant BS  Cardiovascular: RRR, heart sounds normal, no murmur or gallops, no peripheral edema  Abdomen: soft and NT, no HSM,  BS normal  Musculoskeletal: No deformities, no cyanosis or clubbing  Neuro: alert, non focal  Skin: Warm, no lesions or rashes        Assessment & Plan:   COPD Gold stage B. COPD stable at this time Plan Maintain Advair twice daily Return 6 months    Updated Medication List Outpatient Encounter Prescriptions as of 10/30/2013  Medication Sig  . albuterol (PROVENTIL HFA;VENTOLIN HFA) 108 (90 BASE) MCG/ACT inhaler Inhale 2 puffs into the lungs every 6 (six) hours as needed for wheezing.  Marland Kitchen. allopurinol (ZYLOPRIM) 300 MG tablet TAKE 1 TABLET BY MOUTH DAILY  . BENICAR 40 MG tablet TAKE ONE TABLET BY MOUTH DAILY  . carvedilol (COREG) 12.5 MG tablet TAKE ONE TABLET BY MOUTH TWICE DAILY WITH MEALS  . Fluticasone-Salmeterol (ADVAIR DISKUS) 250-50 MCG/DOSE AEPB INHALE 1 PUFF INTO THE LUNGS 2 (TWO) TIMES DAILY.  . Multiple Vitamins-Minerals (CENTRUM SILVER PO) Take by mouth daily.    . predniSONE (DELTASONE) 2.5 MG tablet Take 2.5 mg by mouth daily with breakfast.  . traMADol (ULTRAM) 50 MG tablet Take 1 tablet (50 mg total) by mouth every 8 (eight) hours as needed.  Marland Kitchen. VITAMIN D, CHOLECALCIFEROL, PO Take 1,000 Units by mouth daily.    . [DISCONTINUED] alendronate (FOSAMAX) 70 MG tablet Take 1 tablet (70 mg total) by mouth  every 7 (seven) days. Take with a full glass of water on an  empty stomach.  . [DISCONTINUED] fluticasone (FLONASE) 50 MCG/ACT nasal spray Place 2 sprays into the nose daily as needed.  . [DISCONTINUED] labetalol (NORMODYNE) 200 MG tablet Take 200 mg by mouth 2 (two) times daily.  . [DISCONTINUED] lidocaine (LIDODERM) 5 % Place 1 patch onto the skin daily. Remove & Discard patch within 12 hours or as directed by MD

## 2013-10-30 NOTE — Assessment & Plan Note (Signed)
Gold stage B. COPD stable at this time Plan Maintain Advair twice daily Return 6 months

## 2013-10-30 NOTE — Patient Instructions (Signed)
No change in medications. Return in     6 months Prevnar 13 was given

## 2013-11-14 ENCOUNTER — Other Ambulatory Visit: Payer: Self-pay | Admitting: Family

## 2013-11-20 ENCOUNTER — Ambulatory Visit (INDEPENDENT_AMBULATORY_CARE_PROVIDER_SITE_OTHER): Payer: Medicare Other | Admitting: Internal Medicine

## 2013-11-20 VITALS — BP 142/85

## 2013-11-20 DIAGNOSIS — R600 Localized edema: Secondary | ICD-10-CM

## 2013-11-20 DIAGNOSIS — M353 Polymyalgia rheumatica: Secondary | ICD-10-CM

## 2013-11-20 DIAGNOSIS — R609 Edema, unspecified: Secondary | ICD-10-CM

## 2013-11-20 DIAGNOSIS — M810 Age-related osteoporosis without current pathological fracture: Secondary | ICD-10-CM

## 2013-11-20 NOTE — Patient Instructions (Signed)
Prednisone 2.5 mg every other day for one month and then stop the prednisone

## 2013-11-20 NOTE — Progress Notes (Signed)
Thoracic compression fracture- improving She admits to a slow recovery.  EDEma- feet.   Patient has polymyalgia rheumatica. She has taken prednisone for quite some time. She is not a low dose. She initially was tried on Fosamax but that caused profound nausea. She has not been treated for osteoporosis since that time.  Past Medical History  Diagnosis Date   Allergy     rhinitis   COPD (chronic obstructive pulmonary disease)     stage II Golds   Hyperlipidemia    Hypertension    Osteoporosis    Gout    PMR (polymyalgia rheumatica)     cylic prednisone    History   Social History   Marital Status: Widowed    Spouse Name: N/A    Number of Children: N/A   Years of Education: N/A   Occupational History   Not on file.   Social History Main Topics   Smoking status: Former Smoker -- 0.80 packs/day for 26 years    Types: Cigarettes    Quit date: 08/03/1997   Smokeless tobacco: Never Used   Alcohol Use: 0.0 oz/week   Drug Use: No   Sexual Activity: Not on file   Other Topics Concern   Not on file   Social History Narrative   No narrative on file    Past Surgical History  Procedure Laterality Date   Dilation and curettage of uterus     Fractured arm      surgical repair   Thyroglossal duct cyst      Family History  Problem Relation Age of Onset   Heart disease Mother    Diabetes Mother    Cancer Father     lung    Allergies  Allergen Reactions   Aspirin     REACTION: reflux   Avelox [Moxifloxacin Hcl In Nacl]     Tremors , shakes, nausea    Colchicine     REACTION: myalgia   Fosamax [Alendronate Sodium]     Nausea for 2 weeks    Current Outpatient Prescriptions on File Prior to Visit  Medication Sig Dispense Refill   albuterol (PROVENTIL HFA;VENTOLIN HFA) 108 (90 BASE) MCG/ACT inhaler Inhale 2 puffs into the lungs every 6 (six) hours as needed for wheezing.  8.5 g  6   allopurinol (ZYLOPRIM) 300 MG tablet TAKE 1 TABLET  BY MOUTH DAILY  30 tablet  5   BENICAR 40 MG tablet TAKE ONE TABLET BY MOUTH DAILY  30 tablet  2   carvedilol (COREG) 12.5 MG tablet TAKE ONE TABLET BY MOUTH TWICE DAILY WITH MEALS  60 tablet  11   Fluticasone-Salmeterol (ADVAIR DISKUS) 250-50 MCG/DOSE AEPB INHALE 1 PUFF INTO THE LUNGS 2 (TWO) TIMES DAILY.  60 each  11   Multiple Vitamins-Minerals (CENTRUM SILVER PO) Take by mouth daily.         predniSONE (DELTASONE) 2.5 MG tablet Take 2.5 mg by mouth daily with breakfast.       traMADol (ULTRAM) 50 MG tablet Take 1 tablet (50 mg total) by mouth every 8 (eight) hours as needed.  30 tablet  0   VITAMIN D, CHOLECALCIFEROL, PO Take 1,000 Units by mouth daily.         No current facility-administered medications on file prior to visit.     patient denies chest pain, shortness of breath, orthopnea. Denies lower extremity edema, abdominal pain, change in appetite, change in bowel movements. Patient denies rashes, musculoskeletal complaints. No other specific complaints in a  complete review of systems.   BP 142/85 elderly female in no acute distress. HEENT exam atraumatic, normocephalic, extraocular muscles are intact. Neck is supple. No jugular venous distention no thyromegaly. Chest clear to auscultation without increased work of breathing. Cardiac exam S1 and S2 are regular. Abdominal exam active bowel sounds, soft, nontender. Extremities no edema. Neurologic exam she is alert without any motor sensory deficits. Gait is normal.   OSTEOPOROSIS She has a compression fracture. She no doubt has osteoporosis based on her age and pre-treatment with prednisone. She is intolerant to oral bisphosphonates. It's probably worth treating her with probably a. We will call her back for treatment. Will also taper prednisone.  Polymyalgia rheumatica Will try to taper off low-dose prednisone over the next month. See patient instructions.  Lower extremity edema This is a chronic, mild problem. She is  unable to use compression stockings. She will elevate feet when able.

## 2013-11-21 NOTE — Assessment & Plan Note (Signed)
She has a compression fracture. She no doubt has osteoporosis based on her age and pre-treatment with prednisone. She is intolerant to oral bisphosphonates. It's probably worth treating her with probably a. We will call her back for treatment. Will also taper prednisone.

## 2013-11-21 NOTE — Assessment & Plan Note (Signed)
This is a chronic, mild problem. She is unable to use compression stockings. She will elevate feet when able.

## 2013-11-21 NOTE — Assessment & Plan Note (Signed)
Will try to taper off low-dose prednisone over the next month. See patient instructions.

## 2013-11-29 ENCOUNTER — Telehealth: Payer: Self-pay | Admitting: Internal Medicine

## 2013-11-29 NOTE — Telephone Encounter (Signed)
I called pt to schedule Prolia Injection but she wants to ask some questions about the possible side effects.  She would like a callback to discuss her past history of side effects with medication before scheduling injection.

## 2013-11-30 NOTE — Telephone Encounter (Signed)
No answer

## 2013-11-30 NOTE — Telephone Encounter (Signed)
Pt states that she does not want to get nauseas from the prolia.  I went over possible side effects (ie. Rash, tenderness at injection site, skin irritation) with her and explained to her that it was a shot but she still wants Dr Cato MulliganSwords advice. Please advise

## 2013-12-01 NOTE — Telephone Encounter (Signed)
appt scheduled

## 2013-12-01 NOTE — Telephone Encounter (Signed)
She should take prolia

## 2013-12-04 ENCOUNTER — Ambulatory Visit (INDEPENDENT_AMBULATORY_CARE_PROVIDER_SITE_OTHER): Payer: Medicare Other | Admitting: *Deleted

## 2013-12-04 DIAGNOSIS — M810 Age-related osteoporosis without current pathological fracture: Secondary | ICD-10-CM

## 2013-12-04 MED ORDER — DENOSUMAB 60 MG/ML ~~LOC~~ SOLN
60.0000 mg | Freq: Once | SUBCUTANEOUS | Status: AC
  Administered 2013-12-04: 60 mg via SUBCUTANEOUS

## 2014-02-21 ENCOUNTER — Encounter: Payer: Self-pay | Admitting: Family

## 2014-02-21 ENCOUNTER — Ambulatory Visit (INDEPENDENT_AMBULATORY_CARE_PROVIDER_SITE_OTHER): Payer: Medicare Other | Admitting: Family

## 2014-02-21 ENCOUNTER — Ambulatory Visit: Payer: Medicare Other | Admitting: Family

## 2014-02-21 VITALS — BP 150/80 | HR 83 | Ht <= 58 in | Wt 144.0 lb

## 2014-02-21 DIAGNOSIS — R609 Edema, unspecified: Secondary | ICD-10-CM

## 2014-02-21 DIAGNOSIS — J449 Chronic obstructive pulmonary disease, unspecified: Secondary | ICD-10-CM

## 2014-02-21 DIAGNOSIS — I10 Essential (primary) hypertension: Secondary | ICD-10-CM

## 2014-02-21 LAB — POCT URINALYSIS DIPSTICK
Bilirubin, UA: NEGATIVE
Blood, UA: NEGATIVE
GLUCOSE UA: NEGATIVE
Ketones, UA: NEGATIVE
NITRITE UA: NEGATIVE
Protein, UA: NEGATIVE
Spec Grav, UA: 1.01
Urobilinogen, UA: 0.2
pH, UA: 5.5

## 2014-02-21 LAB — HEPATIC FUNCTION PANEL
ALK PHOS: 61 U/L (ref 39–117)
ALT: 15 U/L (ref 0–35)
AST: 18 U/L (ref 0–37)
Albumin: 4.1 g/dL (ref 3.5–5.2)
Bilirubin, Direct: 0.1 mg/dL (ref 0.0–0.3)
TOTAL PROTEIN: 6.7 g/dL (ref 6.0–8.3)
Total Bilirubin: 0.7 mg/dL (ref 0.2–1.2)

## 2014-02-21 LAB — BASIC METABOLIC PANEL
BUN: 19 mg/dL (ref 6–23)
CALCIUM: 9.4 mg/dL (ref 8.4–10.5)
CO2: 24 meq/L (ref 19–32)
CREATININE: 0.6 mg/dL (ref 0.4–1.2)
Chloride: 107 mEq/L (ref 96–112)
GFR: 97.56 mL/min (ref >60.00)
GLUCOSE: 92 mg/dL (ref 70–99)
Potassium: 4.4 mEq/L (ref 3.5–5.1)
SODIUM: 138 meq/L (ref 135–145)

## 2014-02-21 LAB — TSH: TSH: 0.38 u[IU]/mL (ref 0.35–4.50)

## 2014-02-21 MED ORDER — FUROSEMIDE 20 MG PO TABS: 20.0000 mg | ORAL_TABLET | Freq: Every day | ORAL | Status: DC

## 2014-02-21 NOTE — Progress Notes (Signed)
Subjective:    Patient ID: [name], female    DOB: 02/10/1920, 80 y.o.   MRN: [id number]  HPI 80 year old, white female, nonsmoker, with a history of hyperlipidemia, gout, COPD, is in today with c/o peripheral edema x 3 months and worsening over the lat 1 month. Denies SOB.     Review of Systems  Constitutional: Negative.   Respiratory: Negative.  Negative for shortness of breath.   Cardiovascular: Positive for leg swelling. Negative for chest pain and palpitations.  Gastrointestinal: Negative.   Endocrine: Negative.   Genitourinary: Negative.   Musculoskeletal: Negative.   Skin: Negative.   Neurological: Negative.   Hematological: Negative.   Psychiatric/Behavioral: Negative.    Past Medical History  Diagnosis Date  . Allergy     rhinitis  . COPD (chronic obstructive pulmonary disease)     stage II Golds  . Hyperlipidemia   . Hypertension   . Osteoporosis   . Gout   . PMR (polymyalgia rheumatica)     cylic prednisone    History   Social History  . Marital Status: Widowed    Spouse Name: N/A    Number of Children: N/A  . Years of Education: N/A   Occupational History  . Not on file.   Social History Main Topics  . Smoking status: Former Smoker -- 0.80 packs/day for 26 years    Types: Cigarettes    Quit date: 08/03/1997  . Smokeless tobacco: Never Used  . Alcohol Use: 0.0 oz/week  . Drug Use: No  . Sexual Activity: Not on file   Other Topics Concern  . Not on file   Social History Narrative  . No narrative on file    Past Surgical History  Procedure Laterality Date  . Dilation and curettage of uterus    . Fractured arm      surgical repair  . Thyroglossal duct cyst      Family History  Problem Relation Age of Onset  . Heart disease Mother   . Diabetes Mother   . Cancer Father     lung    Allergies  Allergen Reactions  . Aspirin     REACTION: reflux  . Avelox [Moxifloxacin Hcl In Nacl]     Tremors , shakes, nausea   .  Colchicine     REACTION: myalgia  . Fosamax [Alendronate Sodium]     Nausea for 2 weeks    Current Outpatient Prescriptions on File Prior to Visit  Medication Sig Dispense Refill  . albuterol (PROVENTIL HFA;VENTOLIN HFA) 108 (90 BASE) MCG/ACT inhaler Inhale 2 puffs into the lungs every 6 (six) hours as needed for wheezing.  8.5 g  6  . allopurinol (ZYLOPRIM) 300 MG tablet TAKE 1 TABLET BY MOUTH DAILY  30 tablet  5  . BENICAR 40 MG tablet TAKE ONE TABLET BY MOUTH DAILY  30 tablet  2  . carvedilol (COREG) 12.5 MG tablet TAKE ONE TABLET BY MOUTH TWICE DAILY WITH MEALS  60 tablet  11  . Fluticasone-Salmeterol (ADVAIR DISKUS) 250-50 MCG/DOSE AEPB INHALE 1 PUFF INTO THE LUNGS 2 (TWO) TIMES DAILY.  60 each  11  . Multiple Vitamins-Minerals (CENTRUM SILVER PO) Take by mouth daily.        Marland Kitchen. VITAMIN D, CHOLECALCIFEROL, PO Take 1,000 Units by mouth daily.        . predniSONE (DELTASONE) 2.5 MG tablet Take 2.5 mg by mouth daily with breakfast.      . traMADol (ULTRAM) 50 MG  tablet Take 1 tablet (50 mg total) by mouth every 8 (eight) hours as needed.  30 tablet  0   No current facility-administered medications on file prior to visit.    BP 150/80  Pulse 83  Ht 4\' 10"  (1.473 m)  Wt 144 lb (65.318 kg)  BMI 30.10 kg/m2chart    Objective:   Physical Exam  Constitutional: She is oriented to person, place, and time. She appears well-developed and well-nourished.  Neck: Normal range of motion. Neck supple.  Cardiovascular: Normal rate, regular rhythm and normal heart sounds.   Pulmonary/Chest: Effort normal and breath sounds normal.  Abdominal: Soft. Bowel sounds are normal.  Musculoskeletal: Normal range of motion. She exhibits edema. She exhibits no tenderness.  Neurological: She is alert and oriented to person, place, and time.  Skin: Skin is warm and dry.  Psychiatric: She has a normal mood and affect.          Assessment & Plan:  [name] was seen today for edema.  Diagnoses and  associated orders for this visit:  Peripheral edema - Basic Metabolic Panel - Hepatic Function Panel - TSH - POC Urinalysis Dipstick  Chronic airway obstruction, not elsewhere classified - Hepatic Function Panel  Other Orders - furosemide (LASIX) 20 MG tablet; Take 1 tablet (20 mg total) by mouth daily.   Call the office with any questions or concerns. Recheck as scheduled and as needed.

## 2014-02-21 NOTE — Progress Notes (Signed)
Pre visit review using our clinic review tool, if applicable. No additional management support is needed unless otherwise documented below in the visit note. 

## 2014-02-21 NOTE — Patient Instructions (Signed)
Peripheral Edema °You have swelling in your legs (peripheral edema). This swelling is due to excess accumulation of salt and water in your body. Edema may be a sign of heart, kidney or liver disease, or a side effect of a medication. It may also be due to problems in the leg veins. Elevating your legs and using special support stockings may be very helpful, if the cause of the swelling is due to poor venous circulation. Avoid long periods of standing, whatever the cause. °Treatment of edema depends on identifying the cause. Chips, pretzels, pickles and other salty foods should be avoided. Restricting salt in your diet is almost always needed. Water pills (diuretics) are often used to remove the excess salt and water from your body via urine. These medicines prevent the kidney from reabsorbing sodium. This increases urine flow. °Diuretic treatment may also result in lowering of potassium levels in your body. Potassium supplements may be needed if you have to use diuretics daily. Daily weights can help you keep track of your progress in clearing your edema. You should call your caregiver for follow up care as recommended. °SEEK IMMEDIATE MEDICAL CARE IF:  °· You have increased swelling, pain, redness, or heat in your legs. °· You develop shortness of breath, especially when lying down. °· You develop chest or abdominal pain, weakness, or fainting. °· You have a fever. °Document Released: 08/27/2004 Document Revised: 10/12/2011 Document Reviewed: 08/07/2009 °ExitCare® Patient Information ©2015 [employer]. This information is not intended to replace advice given to you by your health care provider. Make sure you discuss any questions you have with your health care provider. ° °

## 2014-02-23 ENCOUNTER — Telehealth: Payer: Self-pay | Admitting: Internal Medicine

## 2014-02-23 NOTE — Telephone Encounter (Signed)
Pt call and would like to have lab results

## 2014-02-26 NOTE — Telephone Encounter (Signed)
Pt aware labs normal  

## 2014-03-07 ENCOUNTER — Encounter: Payer: Self-pay | Admitting: Family

## 2014-03-07 ENCOUNTER — Ambulatory Visit (INDEPENDENT_AMBULATORY_CARE_PROVIDER_SITE_OTHER): Payer: Medicare Other | Admitting: Family

## 2014-03-07 VITALS — BP 154/80 | HR 64 | Ht <= 58 in | Wt 141.0 lb

## 2014-03-07 DIAGNOSIS — R600 Localized edema: Secondary | ICD-10-CM

## 2014-03-07 DIAGNOSIS — E559 Vitamin D deficiency, unspecified: Secondary | ICD-10-CM

## 2014-03-07 DIAGNOSIS — M109 Gout, unspecified: Secondary | ICD-10-CM

## 2014-03-07 DIAGNOSIS — I10 Essential (primary) hypertension: Secondary | ICD-10-CM

## 2014-03-07 DIAGNOSIS — R609 Edema, unspecified: Secondary | ICD-10-CM

## 2014-03-07 DIAGNOSIS — H6120 Impacted cerumen, unspecified ear: Secondary | ICD-10-CM

## 2014-03-07 DIAGNOSIS — E785 Hyperlipidemia, unspecified: Secondary | ICD-10-CM

## 2014-03-07 DIAGNOSIS — H6121 Impacted cerumen, right ear: Secondary | ICD-10-CM

## 2014-03-07 LAB — HEPATIC FUNCTION PANEL
ALK PHOS: 63 U/L (ref 39–117)
ALT: 18 U/L (ref 0–35)
AST: 21 U/L (ref 0–37)
Albumin: 4 g/dL (ref 3.5–5.2)
BILIRUBIN TOTAL: 0.7 mg/dL (ref 0.2–1.2)
Bilirubin, Direct: 0.1 mg/dL (ref 0.0–0.3)
Total Protein: 6.8 g/dL (ref 6.0–8.3)

## 2014-03-07 LAB — URIC ACID: Uric Acid, Serum: 4.1 mg/dL (ref 2.4–7.0)

## 2014-03-07 LAB — BASIC METABOLIC PANEL
BUN: 34 mg/dL — ABNORMAL HIGH (ref 6–23)
CO2: 24 mEq/L (ref 19–32)
Calcium: 9.6 mg/dL (ref 8.4–10.5)
Chloride: 106 mEq/L (ref 96–112)
Creatinine, Ser: 0.7 mg/dL (ref 0.4–1.2)
GFR: 84.62 mL/min (ref >60.00)
GLUCOSE: 90 mg/dL (ref 70–99)
POTASSIUM: 4.4 meq/L (ref 3.5–5.1)
SODIUM: 138 meq/L (ref 135–145)

## 2014-03-07 MED ORDER — FUROSEMIDE 40 MG PO TABS: 40.0000 mg | ORAL_TABLET | Freq: Every day | ORAL | Status: DC

## 2014-03-07 NOTE — Progress Notes (Signed)
Pre visit review using our clinic review tool, if applicable. No additional management support is needed unless otherwise documented below in the visit note. 

## 2014-03-07 NOTE — Patient Instructions (Signed)
Peripheral Edema °You have swelling in your legs (peripheral edema). This swelling is due to excess accumulation of salt and water in your body. Edema may be a sign of heart, kidney or liver disease, or a side effect of a medication. It may also be due to problems in the leg veins. Elevating your legs and using special support stockings may be very helpful, if the cause of the swelling is due to poor venous circulation. Avoid long periods of standing, whatever the cause. °Treatment of edema depends on identifying the cause. Chips, pretzels, pickles and other salty foods should be avoided. Restricting salt in your diet is almost always needed. Water pills (diuretics) are often used to remove the excess salt and water from your body via urine. These medicines prevent the kidney from reabsorbing sodium. This increases urine flow. °Diuretic treatment may also result in lowering of potassium levels in your body. Potassium supplements may be needed if you have to use diuretics daily. Daily weights can help you keep track of your progress in clearing your edema. You should call your caregiver for follow up care as recommended. °SEEK IMMEDIATE MEDICAL CARE IF:  °· You have increased swelling, pain, redness, or heat in your legs. °· You develop shortness of breath, especially when lying down. °· You develop chest or abdominal pain, weakness, or fainting. °· You have a fever. °Document Released: 08/27/2004 Document Revised: 10/12/2011 Document Reviewed: 08/07/2009 °ExitCare® Patient Information ©2015 [employer]. This information is not intended to replace advice given to you by your health care provider. Make sure you discuss any questions you have with your health care provider. ° °

## 2014-03-07 NOTE — Progress Notes (Signed)
Subjective:    Patient ID: Isabel GrillsCharlotte Haraway, female    DOB: 04/23/1920, 80 y.o.   MRN: [id number]  HPI 80 year old white female, nonsmoker with a history of hypertension, hyperlipidemia, gout is in today for recheck of peripheral edema. Her last office visit, we initiated Lasix 20 mg once daily that has helped. Reports voiding about 2-3 times in the mornings. However, continues to have peripheral edema. Denies any chest pain or palpitations.   Review of Systems  Constitutional: Negative.   HENT: Negative.   Respiratory: Negative.   Cardiovascular: Positive for leg swelling. Negative for chest pain and palpitations.  Gastrointestinal: Negative.   Endocrine: Negative.   Musculoskeletal: Negative.   Skin: Negative.   Allergic/Immunologic: Negative.   Psychiatric/Behavioral: Negative.   All other systems reviewed and are negative.  Past Medical History  Diagnosis Date  . Allergy     rhinitis  . COPD (chronic obstructive pulmonary disease)     stage II Golds  . Hyperlipidemia   . Hypertension   . Osteoporosis   . Gout   . PMR (polymyalgia rheumatica)     cylic prednisone    History   Social History  . Marital Status: Widowed    Spouse Name: N/A    Number of Children: N/A  . Years of Education: N/A   Occupational History  . Not on file.   Social History Main Topics  . Smoking status: Former Smoker -- 0.80 packs/day for 26 years    Types: Cigarettes    Quit date: 08/03/1997  . Smokeless tobacco: Never Used  . Alcohol Use: 0.0 oz/week  . Drug Use: No  . Sexual Activity: Not on file   Other Topics Concern  . Not on file   Social History Narrative  . No narrative on file    Past Surgical History  Procedure Laterality Date  . Dilation and curettage of uterus    . Fractured arm      surgical repair  . Thyroglossal duct cyst      Family History  Problem Relation Age of Onset  . Heart disease Mother   . Diabetes Mother   . Cancer Father     lung     Allergies  Allergen Reactions  . Aspirin     REACTION: reflux  . Avelox [Moxifloxacin Hcl In Nacl]     Tremors , shakes, nausea   . Colchicine     REACTION: myalgia  . Fosamax [Alendronate Sodium]     Nausea for 2 weeks    Current Outpatient Prescriptions on File Prior to Visit  Medication Sig Dispense Refill  . albuterol (PROVENTIL HFA;VENTOLIN HFA) 108 (90 BASE) MCG/ACT inhaler Inhale 2 puffs into the lungs every 6 (six) hours as needed for wheezing.  8.5 g  6  . allopurinol (ZYLOPRIM) 300 MG tablet TAKE 1 TABLET BY MOUTH DAILY  30 tablet  5  . BENICAR 40 MG tablet TAKE ONE TABLET BY MOUTH DAILY  30 tablet  2  . carvedilol (COREG) 12.5 MG tablet TAKE ONE TABLET BY MOUTH TWICE DAILY WITH MEALS  60 tablet  11  . Fluticasone-Salmeterol (ADVAIR DISKUS) 250-50 MCG/DOSE AEPB INHALE 1 PUFF INTO THE LUNGS 2 (TWO) TIMES DAILY.  60 each  11  . Multiple Vitamins-Minerals (CENTRUM SILVER PO) Take by mouth daily.        . predniSONE (DELTASONE) 2.5 MG tablet Take 2.5 mg by mouth daily with breakfast.      . traMADol (ULTRAM) 50 MG tablet Take  1 tablet (50 mg total) by mouth every 8 (eight) hours as needed.  30 tablet  0  . VITAMIN D, CHOLECALCIFEROL, PO Take 1,000 Units by mouth daily.         No current facility-administered medications on file prior to visit.    BP 154/80  Pulse 64  Ht 4\' 10"  (1.473 m)  Wt 141 lb (63.957 kg)  BMI 29.48 kg/m2chart    Objective:   Physical Exam  Constitutional: She is oriented to person, place, and time. She appears well-developed and well-nourished.  Neck: Normal range of motion. Neck supple.  Cardiovascular: Normal rate, regular rhythm and normal heart sounds.   Pulmonary/Chest: Effort normal and breath sounds normal.  Abdominal: Soft. Bowel sounds are normal.  Musculoskeletal: She exhibits edema. She exhibits no tenderness.  Neurological: She is alert and oriented to person, place, and time.  Skin: Skin is warm and dry.  Psychiatric: She has  a normal mood and affect.          Assessment & Plan:  Babbie was seen today for follow-up.  Diagnoses and associated orders for this visit:  Unspecified essential hypertension - Basic Metabolic Panel - Hepatic Function Panel  Other and unspecified hyperlipidemia - Basic Metabolic Panel - Hepatic Function Panel  Cerumen impaction, right  Peripheral edema - Basic Metabolic Panel - Hepatic Function Panel  Gout of elbow, unspecified cause, unspecified chronicity, unspecified laterality - Uric Acid  Unspecified vitamin D deficiency  Other Orders - furosemide (LASIX) 40 MG tablet; Take 1 tablet (40 mg total) by mouth daily.    call the office with any questions or concerns. Recheck in 2 weeks and sooner as needed.

## 2014-03-08 ENCOUNTER — Telehealth: Payer: Self-pay | Admitting: Internal Medicine

## 2014-03-08 NOTE — Telephone Encounter (Signed)
Relevant patient education mailed to patient.  

## 2014-03-21 ENCOUNTER — Other Ambulatory Visit: Payer: Self-pay | Admitting: Family

## 2014-03-21 ENCOUNTER — Encounter: Payer: Self-pay | Admitting: Family

## 2014-03-21 ENCOUNTER — Ambulatory Visit (INDEPENDENT_AMBULATORY_CARE_PROVIDER_SITE_OTHER): Payer: Medicare Other | Admitting: Family

## 2014-03-21 VITALS — BP 138/64 | HR 89 | Temp 97.9°F | Ht <= 58 in | Wt 139.0 lb

## 2014-03-21 DIAGNOSIS — E785 Hyperlipidemia, unspecified: Secondary | ICD-10-CM

## 2014-03-21 DIAGNOSIS — I10 Essential (primary) hypertension: Secondary | ICD-10-CM

## 2014-03-21 DIAGNOSIS — R609 Edema, unspecified: Secondary | ICD-10-CM

## 2014-03-21 LAB — BASIC METABOLIC PANEL
BUN: 44 mg/dL — AB (ref 6–23)
CHLORIDE: 105 meq/L (ref 96–112)
CO2: 23 meq/L (ref 19–32)
Calcium: 9 mg/dL (ref 8.4–10.5)
Creatinine, Ser: 1 mg/dL (ref 0.4–1.2)
GFR: 57.8 mL/min — ABNORMAL LOW (ref >60.00)
Glucose, Bld: 90 mg/dL (ref 70–99)
Potassium: 4.4 mEq/L (ref 3.5–5.1)
Sodium: 138 mEq/L (ref 135–145)

## 2014-03-21 MED ORDER — FUROSEMIDE 20 MG PO TABS: 20.0000 mg | ORAL_TABLET | Freq: Every day | ORAL | Status: DC

## 2014-03-21 NOTE — Progress Notes (Signed)
Pre visit review using our clinic review tool, if applicable. No additional management support is needed unless otherwise documented below in the visit note. 

## 2014-03-21 NOTE — Patient Instructions (Signed)
1. Ace wraps to the lower extremities.   Low-Sodium Eating Plan Sodium raises blood pressure and causes water to be held in the body. Getting less sodium from food will help lower your blood pressure, reduce any swelling, and protect your heart, liver, and kidneys. We get sodium by adding salt (sodium chloride) to food. Most of our sodium comes from canned, boxed, and frozen foods. Restaurant foods, fast foods, and pizza are also very high in sodium. Even if you take medicine to lower your blood pressure or to reduce fluid in your body, getting less sodium from your food is important. WHAT IS MY PLAN? Most people should limit their sodium intake to 2,300 mg a day. Your health care provider recommends that you limit your sodium intake to __________ a day.  WHAT DO I NEED TO KNOW ABOUT THIS EATING PLAN? For the low-sodium eating plan, you will follow these general guidelines:  Choose foods with a % Daily Value for sodium of less than 5% (as listed on the food label).   Use salt-free seasonings or herbs instead of table salt or sea salt.   Check with your health care provider or pharmacist before using salt substitutes.   Eat fresh foods.  Eat more vegetables and fruits.  Limit canned vegetables. If you do use them, rinse them well to decrease the sodium.   Limit cheese to 1 oz (28 g) per day.   Eat lower-sodium products, often labeled as "lower sodium" or "no salt added."  Avoid foods that contain monosodium glutamate (MSG). MSG is sometimes added to [place] food and some canned foods.  Check food labels (Nutrition Facts labels) on foods to learn how much sodium is in one serving.  Eat more home-cooked food and less restaurant, buffet, and fast food.  When eating at a restaurant, ask that your food be prepared with less salt or none, if possible.  HOW DO I READ FOOD LABELS FOR SODIUM INFORMATION? The Nutrition Facts label lists the amount of sodium in one serving of the food.  If you eat more than one serving, you must multiply the listed amount of sodium by the number of servings. Food labels may also identify foods as:  Sodium free--Less than 5 mg in a serving.  Very low sodium--35 mg or less in a serving.  Low sodium--140 mg or less in a serving.  Light in sodium--50% less sodium in a serving. For example, if a food that usually has 300 mg of sodium is changed to become light in sodium, it will have 150 mg of sodium.  Reduced sodium--25% less sodium in a serving. For example, if a food that usually has 400 mg of sodium is changed to reduced sodium, it will have 300 mg of sodium. WHAT FOODS CAN I EAT? Grains Low-sodium cereals, including oats, puffed wheat and rice, and shredded wheat cereals. Low-sodium crackers. Unsalted rice and pasta. Lower-sodium bread.  Vegetables Frozen or fresh vegetables. Low-sodium or reduced-sodium canned vegetables. Low-sodium or reduced-sodium tomato sauce and paste. Low-sodium or reduced-sodium tomato and vegetable juices.  Fruits Fresh, frozen, and canned fruit. Fruit juice.  Meat and Other Protein Products Low-sodium canned tuna and salmon. Fresh or frozen meat, poultry, seafood, and fish. Lamb. Unsalted nuts. Dried beans, peas, and lentils without added salt. Unsalted canned beans. Homemade soups without salt. Eggs.  Dairy Milk. Soy milk. Ricotta cheese. Low-sodium or reduced-sodium cheeses. Yogurt.  Condiments Fresh and dried herbs and spices. Salt-free seasonings. Onion and garlic powders. Low-sodium varieties of  mustard and ketchup. Lemon juice.  Fats and Oils Reduced-sodium salad dressings. Unsalted butter.  Other Unsalted popcorn and pretzels.  The items listed above may not be a complete list of recommended foods or beverages. Contact your dietitian for more options. WHAT FOODS ARE NOT RECOMMENDED? Grains Instant hot cereals. Bread stuffing, pancake, and biscuit mixes. Croutons. Seasoned rice or pasta  mixes. Noodle soup cups. Boxed or frozen macaroni and cheese. Self-rising flour. Regular salted crackers. Vegetables Regular canned vegetables. Regular canned tomato sauce and paste. Regular tomato and vegetable juices. Frozen vegetables in sauces. Salted french fries. Olives. Rosita FirePickles. Relishes. Sauerkraut. Salsa. Meat and Other Protein Products Salted, canned, smoked, spiced, or pickled meats, seafood, or fish. Bacon, ham, sausage, hot dogs, corned beef, chipped beef, and packaged luncheon meats. Salt pork. Jerky. Pickled herring. Anchovies, regular canned tuna, and sardines. Salted nuts. Dairy Processed cheese and cheese spreads. Cheese curds. Blue cheese and cottage cheese. Buttermilk.  Condiments Onion and garlic salt, seasoned salt, table salt, and sea salt. Canned and packaged gravies. Worcestershire sauce. Tartar sauce. Barbecue sauce. Teriyaki sauce. Soy sauce, including reduced sodium. Steak sauce. Fish sauce. Oyster sauce. Cocktail sauce. Horseradish. Regular ketchup and mustard. Meat flavorings and tenderizers. Bouillon cubes. Hot sauce. Tabasco sauce. Marinades. Taco seasonings. Relishes. Fats and Oils Regular salad dressings. Salted butter. Margarine. Ghee. Bacon fat.  Other Potato and tortilla chips. Corn chips and puffs. Salted popcorn and pretzels. Canned or dried soups. Pizza. Frozen entrees and pot pies.  The items listed above may not be a complete list of foods and beverages to avoid. Contact your dietitian for more information. Document Released: 01/09/2002 Document Revised: 07/25/2013 Document Reviewed: 05/24/2013 [facility] Patient Information 2015 [place], [place]. This information is not intended to replace advice given to you by your health care provider. Make sure you discuss any questions you have with your health care provider.

## 2014-03-21 NOTE — Progress Notes (Signed)
Subjective:    Patient ID: [name], female    DOB: 07/28/1920, 80 y.o.   MRN: [id number]  HPI 80 year old white female, nonsmoker than today for recheck of peripheral edema. Her last office visit we increased Lasix to 40 mg once daily. She was able to tolerate the dose for approximately one week and began to have loss of appetite and fatigue. She has a history of hypertension, hyperlipidemia, gout.    Review of Systems  Constitutional: Positive for fatigue. Negative for chills and unexpected weight change.  HENT: Negative.   Respiratory: Negative.   Cardiovascular: Positive for leg swelling. Negative for chest pain and palpitations.  Gastrointestinal: Negative.   Endocrine: Negative.   Genitourinary: Negative.   Musculoskeletal: Negative.   Skin: Negative.   Allergic/Immunologic: Negative.   Neurological: Negative.   Hematological: Negative.   Psychiatric/Behavioral: Negative.    Past Medical History  Diagnosis Date  . Allergy     rhinitis  . COPD (chronic obstructive pulmonary disease)     stage II Golds  . Hyperlipidemia   . Hypertension   . Osteoporosis   . Gout   . PMR (polymyalgia rheumatica)     cylic prednisone    History   Social History  . Marital Status: Widowed    Spouse Name: N/A    Number of Children: N/A  . Years of Education: N/A   Occupational History  . Not on file.   Social History Main Topics  . Smoking status: Former Smoker -- 0.80 packs/day for 26 years    Types: Cigarettes    Quit date: 08/03/1997  . Smokeless tobacco: Never Used  . Alcohol Use: 0.0 oz/week  . Drug Use: No  . Sexual Activity: Not on file   Other Topics Concern  . Not on file   Social History Narrative  . No narrative on file    Past Surgical History  Procedure Laterality Date  . Dilation and curettage of uterus    . Fractured arm      surgical repair  . Thyroglossal duct cyst      Family History  Problem Relation Age of Onset  . Heart disease  Mother   . Diabetes Mother   . Cancer Father     lung    Allergies  Allergen Reactions  . Aspirin     REACTION: reflux  . Avelox [Moxifloxacin Hcl In Nacl]     Tremors , shakes, nausea   . Colchicine     REACTION: myalgia  . Fosamax [Alendronate Sodium]     Nausea for 2 weeks    Current Outpatient Prescriptions on File Prior to Visit  Medication Sig Dispense Refill  . albuterol (PROVENTIL HFA;VENTOLIN HFA) 108 (90 BASE) MCG/ACT inhaler Inhale 2 puffs into the lungs every 6 (six) hours as needed for wheezing.  8.5 g  6  . allopurinol (ZYLOPRIM) 300 MG tablet TAKE 1 TABLET BY MOUTH DAILY  30 tablet  5  . BENICAR 40 MG tablet TAKE ONE TABLET BY MOUTH DAILY  30 tablet  2  . carvedilol (COREG) 12.5 MG tablet TAKE ONE TABLET BY MOUTH TWICE DAILY WITH MEALS  60 tablet  11  . Fluticasone-Salmeterol (ADVAIR DISKUS) 250-50 MCG/DOSE AEPB INHALE 1 PUFF INTO THE LUNGS 2 (TWO) TIMES DAILY.  60 each  11  . Multiple Vitamins-Minerals (CENTRUM SILVER PO) Take by mouth daily.        . predniSONE (DELTASONE) 2.5 MG tablet Take 2.5 mg by mouth daily with breakfast.      .  traMADol (ULTRAM) 50 MG tablet Take 1 tablet (50 mg total) by mouth every 8 (eight) hours as needed.  30 tablet  0  . VITAMIN D, CHOLECALCIFEROL, PO Take 1,000 Units by mouth daily.         No current facility-administered medications on file prior to visit.    BP 138/64  Pulse 89  Temp(Src) 97.9 F (36.6 C) (Oral)  Ht 4\' 10"  (1.473 m)  Wt 139 lb (63.05 kg)  BMI 29.06 kg/m2  SpO2 94%chart    Objective:   Physical Exam  Constitutional: She is oriented to person, place, and time. She appears well-developed and well-nourished.  HENT:  Right Ear: External ear normal.  Left Ear: External ear normal.  Nose: Nose normal.  Mouth/Throat: Oropharynx is clear and moist.  Neck: Normal range of motion. Neck supple.  Cardiovascular: Normal rate, regular rhythm and normal heart sounds.   Pulmonary/Chest: Effort normal and breath  sounds normal.  Abdominal: Soft. Bowel sounds are normal.  Musculoskeletal: She exhibits edema.  1+ pitting edema  Neurological: She is alert and oriented to person, place, and time.  Skin: Skin is warm and dry.  Psychiatric: She has a normal mood and affect.          Assessment & Plan:  [name] was seen today for follow-up.  Diagnoses and associated orders for this visit:  Peripheral edema - Basic Metabolic Panel  Unspecified essential hypertension  Other and unspecified hyperlipidemia  Other Orders - furosemide (LASIX) 20 MG tablet; Take 1 tablet (20 mg total) by mouth daily.    call the office with a loss of the concerns. Recheck in 3 months or sooner as needed.

## 2014-03-23 ENCOUNTER — Other Ambulatory Visit: Payer: Self-pay | Admitting: Internal Medicine

## 2014-04-27 ENCOUNTER — Ambulatory Visit (INDEPENDENT_AMBULATORY_CARE_PROVIDER_SITE_OTHER): Payer: Medicare Other | Admitting: Critical Care Medicine

## 2014-04-27 ENCOUNTER — Encounter: Payer: Self-pay | Admitting: Critical Care Medicine

## 2014-04-27 VITALS — BP 154/82 | HR 84 | Temp 97.7°F | Ht 59.0 in | Wt 138.0 lb

## 2014-04-27 DIAGNOSIS — Z23 Encounter for immunization: Secondary | ICD-10-CM

## 2014-04-27 DIAGNOSIS — J449 Chronic obstructive pulmonary disease, unspecified: Secondary | ICD-10-CM

## 2014-04-27 MED ORDER — ALBUTEROL SULFATE HFA 108 (90 BASE) MCG/ACT IN AERS: 2.0000 | INHALATION_SPRAY | Freq: Four times a day (QID) | RESPIRATORY_TRACT | Status: AC | PRN

## 2014-04-27 MED ORDER — FLUTICASONE-SALMETEROL 250-50 MCG/DOSE IN AEPB: INHALATION_SPRAY | RESPIRATORY_TRACT | Status: AC

## 2014-04-27 NOTE — Progress Notes (Signed)
Subjective:    Patient ID: [name], female    DOB: 03-Apr-1920, 80 y.o.   MRN: [id number]  HPI  80 y.o.  WF Copd  04/27/2014 Chief Complaint  Patient presents with  . 6 month follow up    Breathing doing well overall.  DOE is unchanged.  Nonprod cough at times and increased PND x 1 wk.  No chest tightness/pain.  No new dyspnea issues  Aspercream helps.  No real cough. Some pndrip noted.       Review of Systems  Constitutional:   No  weight loss, night sweats,  Fevers, chills, fatigue, lassitude. HEENT:   No headaches,  Difficulty swallowing,  Tooth/dental problems,  Sore throat,                No sneezing, itching, ear ache,  +nasal congestion, ++post nasal drip,   CV:  No chest pain,  Orthopnea, PND, notes mild swelling in lower extremities, no anasarca, dizziness, palpitations  GI  No heartburn, indigestion, abdominal pain, nausea, vomiting, diarrhea, change in bowel habits, loss of appetite  Resp:   No coughing up of blood.  No change in color of mucus.  No wheezing.  No chest wall deformity  Skin: no rash or lesions.  GU: no dysuria, change in color of urine, no urgency or frequency.  No flank pain.  MS:  No joint pain or swelling.  No decreased range of motion.  No back pain.  Psych:  No change in mood or affect. No depression or anxiety.  No memory loss.     Objective:   Physical Exam  Filed Vitals:   04/27/14 1007  BP: 154/82  Pulse: 84  Temp: 97.7 F (36.5 C)  TempSrc: Oral  Height:  (1.499 m)  Weight: 138 lb (62.596 kg)  SpO2: 95%    Gen: Pleasant, well-nourished, in no distress,  normal affect  ENT: No lesions,  mouth clear,  oropharynx clear, no postnasal drip  Neck: No JVD, no TMG, no carotid bruits  Lungs: No use of accessory muscles, no dullness to percussion, distant BS  Cardiovascular: RRR, heart sounds normal, no murmur or gallops, no peripheral edema  Abdomen: soft and NT, no HSM,  BS normal  Musculoskeletal: No  deformities, no cyanosis or clubbing  Neuro: alert, non focal  Skin: Warm, no lesions or rashes        Assessment & Plan:   COPD Gold stage B. COPD stable at this time Plan Maintain inhaled medications as prescribed    flu vaccine was administered  Updated Medication List Outpatient Encounter Prescriptions as of 04/27/2014  Medication Sig  . albuterol (PROVENTIL HFA;VENTOLIN HFA) 108 (90 BASE) MCG/ACT inhaler Inhale 2 puffs into the lungs every 6 (six) hours as needed for wheezing.  Marland Kitchen allopurinol (ZYLOPRIM) 300 MG tablet TAKE 1 TABLET BY MOUTH DAILY  . BENICAR 40 MG tablet TAKE ONE TABLET BY MOUTH DAILY  . carvedilol (COREG) 12.5 MG tablet TAKE ONE TABLET BY MOUTH TWICE DAILY WITH MEALS  . Fluticasone-Salmeterol (ADVAIR DISKUS) 250-50 MCG/DOSE AEPB INHALE 1 PUFF INTO THE LUNGS 2 (TWO) TIMES DAILY.  . furosemide (LASIX) 20 MG tablet TAKE 1 TABLET (20 MG TOTAL) BY MOUTH DAILY.  . Multiple Vitamins-Minerals (CENTRUM SILVER PO) Take by mouth daily.    Marland Kitchen VITAMIN D, CHOLECALCIFEROL, PO Take 1,000 Units by mouth daily.    . [DISCONTINUED] albuterol (PROVENTIL HFA;VENTOLIN HFA) 108 (90 BASE) MCG/ACT inhaler Inhale 2 puffs into the lungs every 6 (six) hours  as needed for wheezing.  . [DISCONTINUED] Fluticasone-Salmeterol (ADVAIR DISKUS) 250-50 MCG/DOSE AEPB INHALE 1 PUFF INTO THE LUNGS 2 (TWO) TIMES DAILY.  . [DISCONTINUED] furosemide (LASIX) 20 MG tablet Take 1 tablet (20 mg total) by mouth daily.  . [DISCONTINUED] predniSONE (DELTASONE) 2.5 MG tablet Take 2.5 mg by mouth daily with breakfast.  . [DISCONTINUED] traMADol (ULTRAM) 50 MG tablet Take 1 tablet (50 mg total) by mouth every 8 (eight) hours as needed.

## 2014-04-27 NOTE — Assessment & Plan Note (Signed)
Gold stage B. COPD stable at this time Plan Maintain inhaled medications as prescribed  

## 2014-04-27 NOTE — Patient Instructions (Addendum)
No change in medications. Flu vaccine  Return in      4 months

## 2014-06-21 ENCOUNTER — Ambulatory Visit (INDEPENDENT_AMBULATORY_CARE_PROVIDER_SITE_OTHER): Payer: Medicare Other | Admitting: Family Medicine

## 2014-06-21 ENCOUNTER — Encounter: Payer: Self-pay | Admitting: Family Medicine

## 2014-06-21 VITALS — BP 140/82 | Temp 98.3°F | Ht <= 58 in | Wt 137.0 lb

## 2014-06-21 DIAGNOSIS — I10 Essential (primary) hypertension: Secondary | ICD-10-CM

## 2014-06-21 DIAGNOSIS — M810 Age-related osteoporosis without current pathological fracture: Secondary | ICD-10-CM

## 2014-06-21 DIAGNOSIS — N182 Chronic kidney disease, stage 2 (mild): Secondary | ICD-10-CM | POA: Insufficient documentation

## 2014-06-21 DIAGNOSIS — M150 Primary generalized (osteo)arthritis: Secondary | ICD-10-CM

## 2014-06-21 DIAGNOSIS — N183 Chronic kidney disease, stage 3 unspecified: Secondary | ICD-10-CM

## 2014-06-21 DIAGNOSIS — M159 Polyosteoarthritis, unspecified: Secondary | ICD-10-CM

## 2014-06-21 MED ORDER — DENOSUMAB 60 MG/ML ~~LOC~~ SOLN
60.0000 mg | Freq: Once | SUBCUTANEOUS | Status: AC
  Administered 2014-06-21: 60 mg via SUBCUTANEOUS

## 2014-06-21 NOTE — Progress Notes (Signed)
Pre visit review using our clinic review tool, if applicable. No additional management support is needed unless otherwise documented below in the visit note. 

## 2014-06-21 NOTE — Patient Instructions (Signed)
Things overall look well.   Let's check in 2-3 months from now

## 2014-06-21 NOTE — Assessment & Plan Note (Signed)
Reasonable control given age with goal systolic less than 150. Continue Benicar 40 mg a carvedilol 12.5 mg twice a day. Follow-up next visit.

## 2014-06-21 NOTE — Assessment & Plan Note (Signed)
At her follow-up visit we will need to obtain bmet. On Lasix  daily for her edema seems that her GFR decreased to less than 60 and previously greater than 80. Her Lasix dose was decreased to 20mg  she is currently on. We may need to consider further reduction edema based on bmet. Fortunately, renal protective on benicar which we will continue.

## 2014-06-21 NOTE — Assessment & Plan Note (Signed)
Given for prolia in office today. We will plan on a bone density test in a year and a half.

## 2014-06-21 NOTE — Progress Notes (Signed)
Isabel ConchStephen Keatyn Jawad, MD Phone: [phone]  Subjective:  Patient presents today to establish care with me as their new primary care provider. Patient was formerly a patient of Dr. Cato MulliganSwords. Chief complaint-noted.   Osteoarthritis-reasonable control  Since being weaned off the prednisone she has an occasional pain in the hands and feeling of coolness at night and has to put under water. Usually taked tylenol.  ROS-minor stiffness in the morning  Hypertension-reasonable control given age  Chronic kidney disease stage III-stable BP Readings from Last 3 Encounters:  06/21/14 140/82  04/27/14 154/82  03/21/14 138/64  Home BP monitoring-usually 130s over 70s.  Compliant with medications-yes without side effects ROS-Denies any CP, HA, SOB, blurry vision, LE edema.   Osteoporosis- controlled/stable A compression fracture on prednisone caused her to be weaned off this medication. She is getting her second shot of Prolia today.   The following were reviewed and entered/updated in epic: Past Medical History  Diagnosis Date  . Allergy     rhinitis  . COPD (chronic obstructive pulmonary disease)     stage II Golds  . Hyperlipidemia   . Hypertension   . Osteoporosis   . Gout   . PMR (polymyalgia rheumatica)     cylic prednisone   Patient Active Problem List   Diagnosis Date Noted  . Chronic kidney disease, stage III (moderate) 06/21/2014    Priority: Medium  . Gout 09/04/2009    Priority: Medium  . Polymyalgia rheumatica 03/22/2009    Priority: Medium  . Hyperlipidemia 12/20/2006    Priority: Medium  . Essential hypertension 12/20/2006    Priority: Medium  . COPD (chronic obstructive pulmonary disease) 12/20/2006    Priority: Medium  . Osteoporosis 12/20/2006    Priority: Medium  . Lower extremity edema 06/05/2011    Priority: Low  . Osteoarthritis 05/17/2009    Priority: Low   Past Surgical History  Procedure Laterality Date  . Dilation and curettage of uterus    .  Fractured arm      surgical repair  . Thyroglossal duct cyst      Family History  Problem Relation Age of Onset  . Heart disease Mother   . Diabetes Mother   . Cancer Father     lung    Medications- reviewed and updated Current Outpatient Prescriptions  Medication Sig Dispense Refill  . albuterol (PROVENTIL HFA;VENTOLIN HFA) 108 (90 BASE) MCG/ACT inhaler Inhale 2 puffs into the lungs every 6 (six) hours as needed for wheezing. 8.5 g 6  . allopurinol (ZYLOPRIM) 300 MG tablet TAKE 1 TABLET BY MOUTH DAILY 30 tablet 5  . BENICAR 40 MG tablet TAKE ONE TABLET BY MOUTH DAILY 30 tablet 5  . carvedilol (COREG) 12.5 MG tablet TAKE ONE TABLET BY MOUTH TWICE DAILY WITH MEALS 60 tablet 11  . Fluticasone-Salmeterol (ADVAIR DISKUS) 250-50 MCG/DOSE AEPB INHALE 1 PUFF INTO THE LUNGS 2 (TWO) TIMES DAILY. 60 each 11  . furosemide (LASIX) 20 MG tablet TAKE 1 TABLET (20 MG TOTAL) BY MOUTH DAILY. 90 tablet 2  . Multiple Vitamins-Minerals (CENTRUM SILVER PO) Take by mouth daily.      Marland Kitchen. VITAMIN D, CHOLECALCIFEROL, PO Take 1,000 Units by mouth daily.       No current facility-administered medications for this visit.    Allergies-reviewed and updated Allergies  Allergen Reactions  . Aspirin     REACTION: reflux  . Avelox [Moxifloxacin Hcl In Nacl]     Tremors , shakes, nausea   . Colchicine  REACTION: myalgia  . Fosamax [Alendronate Sodium]     Nausea for 2 weeks    History   Social History  . Marital Status: Widowed    Spouse Name: N/A    Number of Children: N/A  . Years of Education: N/A   Social History Main Topics  . Smoking status: Former Smoker -- 0.80 packs/day for 26 years    Types: Cigarettes    Quit date: 08/03/1997  . Smokeless tobacco: Never Used  . Alcohol Use: 4.2 oz/week    7 Not specified per week  . Drug Use: No  . Sexual Activity: None   Other Topics Concern  . None   Social History Narrative   Lives alone. Widowed (husband died at 3236). 1 daughter ([place] 2 grandkids ([facility] [place] and heather-sports med/accupuncture)  and 2 greatgrandsons.       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    ROS--See HPI   Objective: BP 140/82 mmHg  Temp(Src) 98.3 F (36.8 C) (Oral)  Ht 4\' 9"  (1.448 m)  Wt 137 lb (62.143 kg)  BMI 29.64 kg/m2 Gen: NAD, resting comfortably, appears much younger than stated age CV: RRR no murmurs rubs or gallops Lungs: CTAB no crackles, wheeze, rhonchi Abdomen: soft/nontender/nondistended/normal bowel sounds.  Ext: trace edema Skin: warm, dry, no rash  Neuro: walks with no obvious discomfort down the hall   Assessment/Plan:  Essential hypertension Reasonable control given age with goal systolic less than 150. Continue Benicar 40 mg a carvedilol 12.5 mg twice a day. Follow-up next visit.  Osteoarthritis Discussed the patient's arthritic pain was likely masked by long-term prednisone. Arthritic joints in her hands are now painful. She is using Tylenol as needed. I counseled patient I thought this was a reasonable approach.  Chronic kidney disease, stage III (moderate) At her follow-up visit we will need to obtain bmet. On Lasix  daily for her edema seems that her GFR decreased to less than 60 and previously greater than 80. Her Lasix dose was decreased to 20mg  she is currently on. We may need to consider further reduction edema based on bmet. Fortunately, renal protective on benicar which we will continue.  Osteoporosis Given for prolia in office today. We will plan on a bone density test in a year and a half.   Follow-up visit to 2-3 months.  Meds ordered this encounter  Medications  . denosumab (PROLIA) injection 60 mg    Sig:

## 2014-06-21 NOTE — Assessment & Plan Note (Signed)
Discussed the patient's arthritic pain was likely masked by long-term prednisone. Arthritic joints in her hands are now painful. She is using Tylenol as needed. I counseled patient I thought this was a reasonable approach.

## 2014-07-10 ENCOUNTER — Encounter: Payer: Self-pay | Admitting: Family

## 2014-07-10 ENCOUNTER — Ambulatory Visit (INDEPENDENT_AMBULATORY_CARE_PROVIDER_SITE_OTHER): Payer: Medicare Other | Admitting: Family

## 2014-07-10 VITALS — BP 130/80 | HR 87 | Wt 140.9 lb

## 2014-07-10 DIAGNOSIS — R208 Other disturbances of skin sensation: Secondary | ICD-10-CM

## 2014-07-10 DIAGNOSIS — R200 Anesthesia of skin: Secondary | ICD-10-CM

## 2014-07-10 DIAGNOSIS — G5603 Carpal tunnel syndrome, bilateral upper limbs: Secondary | ICD-10-CM

## 2014-07-10 DIAGNOSIS — G5601 Carpal tunnel syndrome, right upper limb: Secondary | ICD-10-CM

## 2014-07-10 DIAGNOSIS — G5602 Carpal tunnel syndrome, left upper limb: Secondary | ICD-10-CM

## 2014-07-10 MED ORDER — METHYLPREDNISOLONE 4 MG PO KIT: PACK | ORAL | Status: AC

## 2014-07-10 NOTE — Patient Instructions (Signed)
Carpal Tunnel Syndrome °Carpal tunnel syndrome is a disorder of the nervous system in the wrist that causes pain, hand weakness, and/or loss of feeling. Carpal tunnel syndrome is caused by the compression, stretching, or irritation of the median nerve at the wrist joint. Athletes who experience carpal tunnel syndrome may notice a decrease in their performance to the condition, especially for sports that require strong hand or wrist action.  °SYMPTOMS  °· Tingling, numbness, or burning pain in the hand or fingers. °· Inability to sleep due to pain in the hand. °· Sharp pains that shoot from the wrist up the arm or to the fingers, especially at night. °· Morning stiffness or cramping of the hand. °· Thumb weakness, resulting in difficulty holding objects or making a fist. °· Shiny, dry skin on the hand. °· Reduced performance in any sport requiring a strong grip. °CAUSES  °· Median nerve damage at the wrist is caused by pressure due to swelling, inflammation, or scarred tissue. °· Sources of pressure include: °¨ Repetitive gripping or squeezing that causes inflammation of the tendon sheaths. °¨ Scarring or shortening of the ligament that covers the median nerve. °¨ Traumatic injury to the wrist or forearm such as fracture, sprain, or dislocation. °¨ Prolonged hyperextension (wrist bent backward) or hyperflexion (wrist bent downward) of the wrist. °RISK INCREASES WITH: °· Diabetes mellitus. °· Menopause or amenorrhea. °· Rheumatoid arthritis. °· Raynaud disease. °· Pregnancy. °· Gout. °· Kidney disease. °· Ganglion cyst. °· Repetitive hand or wrist action. °· Hypothyroidism (underactive thyroid gland). °· Repetitive jolting or shaking of the hands or wrist. °· Prolonged forceful weight-bearing on the hands. °PREVENTION °· Bracing the hand and wrist straight during activities that involve repetitive grasping. °· For activities that require prolonged extension of the wrist (bending towards the top of the forearm)  periodically change the position of your wrists. °· Learn and use proper technique in activities that result in the wrist position in neutral to slight extension. °· Avoid bending the wrist into full extension or flexion (up or down). °· Keep the wrist in a straight (neutral) position. To keep the wrist in this position, wear a splint. °· Avoid repetitive hand and wrist motions. °· When possible avoid prolonged grasping of items (steering wheel of a car, a pen, a vacuum cleaner, or a rake). °· Loosen your grip for activities that require prolonged grasping of items. °· Place keyboards and writing surfaces at the correct height as to decrease strain on the wrist and hand. °· Alternate work tasks to avoid prolonged wrist flexion. °· Avoid pinching activities (needlework and writing) as they may irritate your carpal tunnel syndrome. °· If these activities are necessary, complete them for shorter periods of time. °· When writing, use a felt tip or rollerball pen and/or build up the grip on a pen to decrease the forces required for writing. °PROGNOSIS  °Carpal tunnel syndrome is usually curable with appropriate conservative treatment and sometimes resolves spontaneously. For some cases, surgery is necessary, especially if muscle wasting or nerve changes have developed.  °RELATED COMPLICATIONS  °· Permanent numbness and a weak thumb or fingers in the affected hand. °· Permanent paralysis of a portion of the hand and fingers. °TREATMENT  °Treatment initially consists of stopping activities that aggravate the symptoms as well as medication and ice to reduce inflammation. A wrist splint is often recommended for wear during activities of repetitive motion as well as at night. It is also important to learn and use proper technique when   performing activities that typically cause pain. On occasion, a corticosteroid injection may be given. °If symptoms persist despite conservative treatment, surgery may be an option. Surgical  techniques free the pinched or compressed nerve. Carpal tunnel surgery is usually performed on an outpatient basis, meaning you go home the same day as surgery. These procedures provide almost complete relief of all symptoms in 95% of patients. Expect at least 2 weeks for healing after surgery. For cases that are the result of repeated jolting or shaking of the hand or wrist or prolonged hyperextension, surgery is not usually recommended because stretching of the median nerve, not compression, is usually the cause of carpal tunnel syndrome in these cases. °MEDICATION  °· If pain medication is necessary, nonsteroidal anti-inflammatory medications, such as aspirin and ibuprofen, or other minor pain relievers, such as acetaminophen, are often recommended. °· Do not take pain medication for 7 days before surgery. °· Prescription pain relievers are usually only prescribed after surgery. Use only as directed and only as much as you need. °· Corticosteroid injections may be given to reduce inflammation. However, they are not always recommended. °· Vitamin B6 (pyridoxine) may reduce symptoms; use only if prescribed for your disorder. °SEEK MEDICAL CARE IF:  °· Symptoms get worse or do not improve in 2 weeks despite treatment. °· You also have a current or recent history of neck or shoulder injury that has resulted in pain or tingling elsewhere in your arm. °Document Released: 07/20/2005 Document Revised: 12/04/2013 Document Reviewed: 11/01/2008 °Exit[employer]® Patient Information ©2015 [employer]. This information is not intended to replace advice given to you by your health care provider. Make sure you discuss any questions you have with your health care provider. ° °

## 2014-07-10 NOTE — Progress Notes (Signed)
Pre visit review using our clinic review tool, if applicable. No additional management support is needed unless otherwise documented below in the visit note. 

## 2014-07-10 NOTE — Progress Notes (Signed)
Subjective:    Patient ID: [name], female    DOB: 07/18/1920, 80 y.o.   MRN: [id number]  HPI 80 year old white female, nonsmoker, patient of Dr. Durene CalHunter is in today with complaints of bilateral hand pain, tingling and numbness over the last 2-3 weeks that's particularly worse at night. Reports the right hand being worse than the left. Pain 5 out of 10. Reports occasional sharp pains that come and go and often awaken her. She is able to shake out her hands and numbness resolves. Reports writing out Christmas cards over the last 2 weeks. Has a history of carpal tunnel syndrome in the past. Also reports recently being discontinued off of daily prednisone by previous PCP. Has taken Tylenol without much relief.   Review of Systems  Respiratory: Negative.   Cardiovascular: Negative.   Gastrointestinal: Negative.   Endocrine: Negative.   Musculoskeletal: Positive for arthralgias.  Skin: Negative.   Neurological: Positive for weakness and numbness.       Bilateral hand weakness and numbness that comes and goes  Psychiatric/Behavioral: Negative.   All other systems reviewed and are negative.  Past Medical History  Diagnosis Date  . Allergy     rhinitis  . COPD (chronic obstructive pulmonary disease)     stage II Golds  . Hyperlipidemia   . Hypertension   . Osteoporosis   . Gout   . PMR (polymyalgia rheumatica)     cylic prednisone    History   Social History  . Marital Status: Widowed    Spouse Name: N/A    Number of Children: N/A  . Years of Education: N/A   Occupational History  . Not on file.   Social History Main Topics  . Smoking status: Former Smoker -- 0.80 packs/day for 26 years    Types: Cigarettes    Quit date: 08/03/1997  . Smokeless tobacco: Never Used  . Alcohol Use: 4.2 oz/week    7 Not specified per week  . Drug Use: No  . Sexual Activity: Not on file   Other Topics Concern  . Not on file   Social History Narrative   Lives alone. Widowed  (husband died at 7136). 1 daughter (lives in [place]). 2 grandkids ([facility] [place] and heather-sports med/accupuncture)  and 2 greatgrandsons.       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    Past Surgical History  Procedure Laterality Date  . Dilation and curettage of uterus    . Fractured arm      surgical repair  . Thyroglossal duct cyst      Family History  Problem Relation Age of Onset  . Heart disease Mother   . Diabetes Mother   . Cancer Father     lung    Allergies  Allergen Reactions  . Aspirin     REACTION: reflux  . Avelox [Moxifloxacin Hcl In Nacl]     Tremors , shakes, nausea   . Colchicine     REACTION: myalgia  . Fosamax [Alendronate Sodium]     Nausea for 2 weeks    Current Outpatient Prescriptions on File Prior to Visit  Medication Sig Dispense Refill  . albuterol (PROVENTIL HFA;VENTOLIN HFA) 108 (90 BASE) MCG/ACT inhaler Inhale 2 puffs into the lungs every 6 (six) hours as needed for wheezing. 8.5 g 6  . allopurinol (ZYLOPRIM) 300 MG tablet TAKE 1 TABLET BY MOUTH DAILY  30 tablet 5  . BENICAR 40 MG tablet TAKE ONE TABLET BY MOUTH DAILY 30 tablet 5  . carvedilol (COREG) 12.5 MG tablet TAKE ONE TABLET BY MOUTH TWICE DAILY WITH MEALS 60 tablet 11  . Fluticasone-Salmeterol (ADVAIR DISKUS) 250-50 MCG/DOSE AEPB INHALE 1 PUFF INTO THE LUNGS 2 (TWO) TIMES DAILY. 60 each 11  . furosemide (LASIX) 20 MG tablet TAKE 1 TABLET (20 MG TOTAL) BY MOUTH DAILY. 90 tablet 2  . Multiple Vitamins-Minerals (CENTRUM SILVER PO) Take by mouth daily.      Marland Kitchen. VITAMIN D, CHOLECALCIFEROL, PO Take 1,000 Units by mouth daily.       No current facility-administered medications on file prior to visit.    BP 130/80 mmHg  Pulse 87  Wt 140 lb 14.4 oz (63.912 kg)chart     Objective:   Physical Exam  Constitutional: She is oriented to person, place, and time. She appears well-developed and well-nourished.    Neck: Normal range of motion. Neck supple.  Cardiovascular: Normal rate, regular rhythm and normal heart sounds.   Pulmonary/Chest: Effort normal and breath sounds normal.  Abdominal: Soft.  Musculoskeletal: Normal range of motion. She exhibits no edema or tenderness.  Negative Tinel sign. Negative Phalen.  Neurological: She is alert and oriented to person, place, and time. She has normal reflexes. No cranial nerve deficit.  Skin: Skin is warm and dry.  Psychiatric: She has a normal mood and affect.          Assessment & Plan:  [name] was seen today for no specified reason.  Diagnoses and associated orders for this visit:  Bilateral carpal tunnel syndrome  Bilateral hand numbness  Other Orders - methylPREDNISolone (MEDROL DOSEPAK) 4 MG tablet; follow package directions    Carpal tunnel syndrome we'll treat with a Medrol pack to get the inflammation under control. Advised periodic Advil or ibuprofen with food as needed. Consider wrist splints at night. Recheck with PCP in 3 weeks and sooner as needed.

## 2014-07-18 ENCOUNTER — Telehealth: Payer: Self-pay | Admitting: Family Medicine

## 2014-07-18 NOTE — Telephone Encounter (Signed)
Pt saw padonda on 12/8 for tingling in hands.  Pt rx prednisone.  Med helped the pain, but caused stomach pains, like "griping sensations".  Pt states she was instructed to let padonda know if she had any problems w/ the prednisone. Pt finished prednisone on Monday and pains started on tues, and worse this am. Pain not unbearable, but pt would like advice.  [employer]

## 2014-07-18 NOTE — Telephone Encounter (Signed)
[name] is out of the office today. Pt can see another provider

## 2014-07-20 NOTE — Telephone Encounter (Signed)
Dr Therapist, nutritionalhunter, can you advise?

## 2014-07-20 NOTE — Telephone Encounter (Signed)
Recheck with Padonda or me next week. I would want to weigh benfits/risks of repeat course of steroids given her side effects.

## 2014-07-21 NOTE — Telephone Encounter (Signed)
Please schedule pt with Dr. Durene CalHunter or Oran ReinPadonda this week

## 2014-07-23 NOTE — Telephone Encounter (Signed)
Pt states she is much much better.  May have been something she ate. [place]oll [place] dr hunter [place] [employer]

## 2014-07-23 NOTE — Telephone Encounter (Signed)
Noted  

## 2014-08-02 ENCOUNTER — Ambulatory Visit: Payer: Medicare Other | Admitting: Family Medicine

## 2014-08-22 ENCOUNTER — Encounter: Payer: Self-pay | Admitting: Family Medicine

## 2014-08-22 ENCOUNTER — Ambulatory Visit (INDEPENDENT_AMBULATORY_CARE_PROVIDER_SITE_OTHER): Payer: Medicare Other | Admitting: Family Medicine

## 2014-08-22 VITALS — BP 146/86 | Temp 97.8°F | Wt 138.0 lb

## 2014-08-22 DIAGNOSIS — I10 Essential (primary) hypertension: Secondary | ICD-10-CM

## 2014-08-22 DIAGNOSIS — R600 Localized edema: Secondary | ICD-10-CM

## 2014-08-22 DIAGNOSIS — R208 Other disturbances of skin sensation: Secondary | ICD-10-CM

## 2014-08-22 DIAGNOSIS — IMO0002 Reserved for concepts with insufficient information to code with codable children: Secondary | ICD-10-CM

## 2014-08-22 DIAGNOSIS — M79604 Pain in right leg: Secondary | ICD-10-CM

## 2014-08-22 LAB — BASIC METABOLIC PANEL
BUN: 28 mg/dL — ABNORMAL HIGH (ref 6–23)
CO2: 27 meq/L (ref 19–32)
CREATININE: 0.65 mg/dL (ref 0.40–1.20)
Calcium: 9.8 mg/dL (ref 8.4–10.5)
Chloride: 108 mEq/L (ref 96–112)
GFR: 90.57 mL/min (ref >60.00)
Glucose, Bld: 98 mg/dL (ref 70–99)
Potassium: 4.5 mEq/L (ref 3.5–5.1)
Sodium: 139 mEq/L (ref 135–145)

## 2014-08-22 MED ORDER — VALSARTAN 320 MG PO TABS: 320.0000 mg | ORAL_TABLET | Freq: Every day | ORAL | Status: AC

## 2014-08-22 NOTE — Patient Instructions (Addendum)
Stop benicar on the day you run out, then start valsartan the next day (call me if too expensive), then please come see me within a week of changing Medicine. Continue your carvedilol.   Continue tylenol as needed for the right leg, if you had any weakness of progressive pain come see us. We could send you to physical therapy but most likely this will improve with time (maybe months).   For your hands, This could be carpal tunnel causing numbness and this also comes from the neck at times. Since the prednisone did not help that much, are only options really are watchful waiting, injection, or consider imaging of neck vs. Seeing orthopedics for potential surgery which i know you don't want. Let's watchfully wait for now.   Check in 2ith me 3 months from now unless you need me sooner

## 2014-08-22 NOTE — Assessment & Plan Note (Signed)
Taking lasix every other day. We will recheck BMET with history CKD and HTN. Hopeful mild improvement on less frequent lasix though could be contributing to increased BP.

## 2014-08-22 NOTE — Progress Notes (Signed)
Tana Conch, MD Phone: [phone]  Subjective:   [name] is a 81 y.o. year old very pleasant female patient who presents with the following:  Hypertension-mild poor control  LE edema stable on lasix  every other day BP Readings from Last 3 Encounters:  08/22/14 146/86  07/10/14 130/80  06/21/14 140/82  Home BP monitoring-no Took lasix  every other day- Compliant with medications-yes without side effects ROS-Denies any CP, HA, SOB, blurry vision, LE edema, transient weakness, orthopnea, PND.   Right leg pain -stepping off of a curve and started having some pain in back of right leg that dwent down to the knee and then calf at times. Pain resolves after she rests. Icy hot patches and tylenol has helped-extra strength arthritis.  ROS-no leg weakness, no giving way of leg, no calf swelling  Bilateral Finger Tingling Bilateral fingers tingling mainly first 3-4 fingers but also in all fingers.  ROS- no hand weakness  Past Medical History- Patient Active Problem List   Diagnosis Date Noted  . Chronic kidney disease, stage III (moderate) 06/21/2014    Priority: Medium  . Gout 09/04/2009    Priority: Medium  . Polymyalgia rheumatica 03/22/2009    Priority: Medium  . Hyperlipidemia 12/20/2006    Priority: Medium  . Essential hypertension 12/20/2006    Priority: Medium  . COPD (chronic obstructive pulmonary disease) 12/20/2006    Priority: Medium  . Osteoporosis 12/20/2006    Priority: Medium  . Lower extremity edema 06/05/2011    Priority: Low  . Osteoarthritis 05/17/2009    Priority: Low   Medications- reviewed and updated Current Outpatient Prescriptions  Medication Sig Dispense Refill  . allopurinol (ZYLOPRIM) 300 MG tablet TAKE 1 TABLET BY MOUTH DAILY 30 tablet 5  . BENICAR 40 MG tablet TAKE ONE TABLET BY MOUTH DAILY 30 tablet 5  . carvedilol (COREG) 12.5 MG tablet TAKE ONE TABLET BY MOUTH TWICE DAILY WITH MEALS 60 tablet 11  . furosemide  (LASIX) 20 MG tablet TAKE 1 TABLET (20 MG TOTAL) BY MOUTH DAILY. 90 tablet 2  . Multiple Vitamins-Minerals (CENTRUM SILVER PO) Take by mouth daily.      Marland Kitchen VITAMIN D, CHOLECALCIFEROL, PO Take 1,000 Units by mouth daily.      Marland Kitchen albuterol (PROVENTIL HFA;VENTOLIN HFA) 108 (90 BASE) MCG/ACT inhaler Inhale 2 puffs into the lungs every 6 (six) hours as needed for wheezing. (Patient not taking: Reported on 08/22/2014) 8.5 g 6  . Fluticasone-Salmeterol (ADVAIR DISKUS) 250-50 MCG/DOSE AEPB INHALE 1 PUFF INTO THE LUNGS 2 (TWO) TIMES DAILY. (Patient not taking: Reported on 08/22/2014) 60 each 11   No current facility-administered medications for this visit.    Objective: BP 146/86 mmHg  Temp(Src) 97.8 F (36.6 C)  Wt 138 lb (62.596 kg) Gen: NAD, resting comfortably in chair CV: RRR no murmurs rubs or gallops Lungs: CTAB no crackles, wheeze, rhonchi Abdomen: soft/nontender/nondistended/normal bowel sounds.  Ext: 1+ pitting edema Some pain in right hamstring with resistance, no calf pain, normal knee exam Hands with negative Tinel but some tingling with phalen maneuver. No rash over hands. Arthritic changes in joints noted.    Assessment/Plan:  Essential hypertension Mild poor control on benicar  and carvedilol 12.5mg  BID-given history of CKD. With age though, reasonable goal 150 SBP. We will transition to valsartan  due to cost nearly $100 per month on benicar and have patient return for repeat BP check.    Lower extremity edema Taking lasix every other day. We will recheck BMET with history  CKD and HTN. Hopeful mild improvement on less frequent lasix though could be contributing to increased BP.    Right leg pain Likely muscle strain. No signs DVT. Reproducible with pressure and doubt radiculopathy. Conservative measures advise-PT if desires in future  Bilateral Finger Tingling Discussed could be cervical in origin or carpal tunnel. Plan to monitor as only mild improvement on steroid  and does not want injection or surgery.   Return precautions advised. 1 week follow up from BP med changes. Every 3 months otherwise.   Orders Placed This Encounter  Procedures  . Basic metabolic panel    [place]    Meds ordered this encounter  Medications  . valsartan (DIOVAN) 320 MG tablet    Sig: Take 1 tablet (320 mg total) by mouth daily.    Dispense:  30 tablet    Refill:  5

## 2014-08-22 NOTE — Assessment & Plan Note (Signed)
Mild poor control on benicar 40mg  and carvedilol 12.5mg  BID-given history of CKD. With age though, reasonable goal 150 SBP. We will transition to valsartan 320mg  due to cost nearly $100 per month on benicar and have patient return for repeat BP check.

## 2014-09-03 ENCOUNTER — Telehealth: Payer: Self-pay | Admitting: Family Medicine

## 2014-09-03 ENCOUNTER — Ambulatory Visit: Payer: Medicare Other | Admitting: Critical Care Medicine

## 2014-09-03 NOTE — Telephone Encounter (Signed)
FYI

## 2014-09-03 NOTE — Telephone Encounter (Signed)
From AVS: "Stop benicar on the day you run out, then start valsartan the next day (call me if too expensive), then please come see me within a week of changing Medicine. Continue your carvedilol. "  Let's have her come in and bring her home cuff with her so we can verify its accuracy

## 2014-09-03 NOTE — Telephone Encounter (Signed)
Pt instructed to report bp after on new med valsartan (DIOVAN) 320 MG tablet bp increased after 3 days to   157/76, 86 p 160/71  93 p 165/77  91 p

## 2014-09-04 NOTE — Telephone Encounter (Signed)
Mrs. Isabel MaxwellCheryl can you please schedule pt to come in for BP f/u.

## 2014-09-05 NOTE — Telephone Encounter (Signed)
Pt scheduled for Fri 09/07/14 I had to use a SDA

## 2014-09-07 ENCOUNTER — Ambulatory Visit (INDEPENDENT_AMBULATORY_CARE_PROVIDER_SITE_OTHER): Payer: Medicare Other | Admitting: Family Medicine

## 2014-09-07 ENCOUNTER — Encounter: Payer: Self-pay | Admitting: Family Medicine

## 2014-09-07 VITALS — BP 160/80 | Temp 97.7°F | Wt 139.0 lb

## 2014-09-07 DIAGNOSIS — N182 Chronic kidney disease, stage 2 (mild): Secondary | ICD-10-CM

## 2014-09-07 DIAGNOSIS — I10 Essential (primary) hypertension: Secondary | ICD-10-CM

## 2014-09-07 MED ORDER — CARVEDILOL 25 MG PO TABS: 25.0000 mg | ORAL_TABLET | Freq: Two times a day (BID) | ORAL | Status: AC

## 2014-09-07 NOTE — Assessment & Plan Note (Signed)
poor control on valsartan 320mg  (recent change from benicar) and carvedilol 12.5mg . Increase carvedilol to 25mg  BID and follow up 7-10 days. Could be with nondaily lasix BP is increasing, fortunately, GFR improved to 91 off daily lasix from 57.

## 2014-09-07 NOTE — Patient Instructions (Signed)
Increase carvedilol to 25 mg (2 pills twice a day until you pick up the larger pill)  Follow up in 7-10 days unless you have new symptoms then see us sooner.   Check blood pressure once a day after resting at least 5 minutes  Glad the leg is a little better with icy hot.

## 2014-09-07 NOTE — Progress Notes (Signed)
  Tana ConchStephen Chaitra Mast, MD Phone: [phone]  Subjective:   Isabel GrillsCharlotte [name] is a 81 y.o. year old very pleasant female patient who presents with the following:  Hypertension-poor control on valsartan 320mg  (recent change from benicar) and carvedilol 12.5mg  BP Readings from Last 3 Encounters:  09/07/14 160/80  08/22/14 146/86  07/10/14 130/80  GFR improved with GFR from 57 to 91 with decreased lasix dosage Home BP monitoring-140/80, 142/66, 143/69 157/76, 160/71, 169/71, 165/77, 152/66, 141/66, 160/69, 162/2775from 1/28 through yesterday Compliant with medications-yes without side effects ROS-Denies any CP, HA, SOB, blurry vision, LE edema.   Past Medical History- Patient Active Problem List   Diagnosis Date Noted  . Gout 09/04/2009    Priority: Medium  . Polymyalgia rheumatica 03/22/2009    Priority: Medium  . Hyperlipidemia 12/20/2006    Priority: Medium  . Essential hypertension 12/20/2006    Priority: Medium  . COPD (chronic obstructive pulmonary disease) 12/20/2006    Priority: Medium  . Osteoporosis 12/20/2006    Priority: Medium  . CKD (chronic kidney disease), stage II 06/21/2014    Priority: Low  . Lower extremity edema 06/05/2011    Priority: Low  . Osteoarthritis 05/17/2009    Priority: Low   Medications- reviewed and updated Current Outpatient Prescriptions  Medication Sig Dispense Refill  . allopurinol (ZYLOPRIM) 300 MG tablet TAKE 1 TABLET BY MOUTH DAILY 30 tablet 5  . carvedilol (COREG) 12.5 MG tablet TAKE ONE TABLET BY MOUTH TWICE DAILY WITH MEALS 60 tablet 11  . Fluticasone-Salmeterol (ADVAIR DISKUS) 250-50 MCG/DOSE AEPB INHALE 1 PUFF INTO THE LUNGS 2 (TWO) TIMES DAILY. 60 each 11  . furosemide (LASIX) 20 MG tablet TAKE 1 TABLET (20 MG TOTAL) BY MOUTH DAILY. 90 tablet 2  . Multiple Vitamins-Minerals (CENTRUM SILVER PO) Take by mouth daily.      . valsartan (DIOVAN) 320 MG tablet Take 1 tablet (320 mg total) by mouth daily. 30 tablet 5  . VITAMIN D,  CHOLECALCIFEROL, PO Take 1,000 Units by mouth daily.      Marland Kitchen. albuterol (PROVENTIL HFA;VENTOLIN HFA) 108 (90 BASE) MCG/ACT inhaler Inhale 2 puffs into the lungs every 6 (six) hours as needed for wheezing. (Patient not taking: Reported on 08/22/2014) 8.5 g 6   No current facility-administered medications for this visit.    Objective: BP 160/80 mmHg  Temp(Src) 97.7 F (36.5 C)  Wt 139 lb (63.05 kg)  Pulse 80 Gen: NAD, resting comfortably in chair CV: RRR no murmurs rubs or gallops Lungs: CTAB no crackles, wheeze, rhonchi Abdomen: soft/nontender/nondistended/normal bowel sounds.  Ext: trace pitting edema bilaterally, R leg larger than L-chronic issue previously evaluated by venous duplex.  Some pain in right hamstring with resistance but less than last week, no calf pain, normal knee exam   Assessment/Plan:  Essential hypertension poor control on valsartan 320mg  (recent change from benicar) and carvedilol 12.5mg . Increase carvedilol to 25mg  BID and follow up 7-10 days. Could be with nondaily lasix BP is increasing, fortunately, GFR improved to 91 off daily lasix from 57.     Return precautions advised. 7-10 day planned follow up.   Meds ordered this encounter  Medications  . carvedilol (COREG) 25 MG tablet    Sig: Take 1 tablet (25 mg total) by mouth 2 (two) times daily with a meal.    Dispense:  60 tablet    Refill:  5

## 2014-09-17 ENCOUNTER — Ambulatory Visit: Payer: Medicare Other | Admitting: Family Medicine

## 2014-09-19 ENCOUNTER — Encounter: Payer: Self-pay | Admitting: Family Medicine

## 2014-09-19 ENCOUNTER — Ambulatory Visit (INDEPENDENT_AMBULATORY_CARE_PROVIDER_SITE_OTHER): Payer: Medicare Other | Admitting: Family Medicine

## 2014-09-19 VITALS — BP 140/82 | HR 76 | Temp 97.9°F | Wt 140.0 lb

## 2014-09-19 DIAGNOSIS — I10 Essential (primary) hypertension: Secondary | ICD-10-CM

## 2014-09-19 NOTE — Progress Notes (Signed)
  Tana ConchStephen Chanler Schreiter, MD Phone: [phone]  Subjective:   Isabel GrillsCharlotte [name] is a 81 y.o. year old very pleasant female patient who presents with the following:  Hypertension-reasonable control on valsartan 320mg  and carvedilol 25mg  BID BP Readings from Last 3 Encounters:  09/19/14 140/82  09/07/14 160/80  08/22/14 146/86  Home BP monitoring-SBP ranging from 133-149, diastolic all <70 HR 57-67 Compliant with medications-yes without side effects ROS-Denies any CP, HA, SOB, blurry vision. Intermittent LE edema improves with elevating legs-taking lasix every other dya. No wheeze  Past Medical History- Patient Active Problem List   Diagnosis Date Noted  . Gout 09/04/2009    Priority: Medium  . Polymyalgia rheumatica 03/22/2009    Priority: Medium  . Hyperlipidemia 12/20/2006    Priority: Medium  . Essential hypertension 12/20/2006    Priority: Medium  . COPD (chronic obstructive pulmonary disease) 12/20/2006    Priority: Medium  . Osteoporosis 12/20/2006    Priority: Medium  . CKD (chronic kidney disease), stage II 06/21/2014    Priority: Low  . Lower extremity edema 06/05/2011    Priority: Low  . Osteoarthritis 05/17/2009    Priority: Low   Medications- reviewed and updated Current Outpatient Prescriptions  Medication Sig Dispense Refill  . allopurinol (ZYLOPRIM) 300 MG tablet TAKE 1 TABLET BY MOUTH DAILY 30 tablet 5  . carvedilol (COREG) 25 MG tablet Take 1 tablet (25 mg total) by mouth 2 (two) times daily with a meal. 60 tablet 5  . Fluticasone-Salmeterol (ADVAIR DISKUS) 250-50 MCG/DOSE AEPB INHALE 1 PUFF INTO THE LUNGS 2 (TWO) TIMES DAILY. 60 each 11  . furosemide (LASIX) 20 MG tablet TAKE 1 TABLET (20 MG TOTAL) BY MOUTH DAILY. 90 tablet 2  . Multiple Vitamins-Minerals (CENTRUM SILVER PO) Take by mouth daily.      . valsartan (DIOVAN) 320 MG tablet Take 1 tablet (320 mg total) by mouth daily. 30 tablet 5  . VITAMIN D, CHOLECALCIFEROL, PO Take 1,000 Units by mouth daily.       Marland Kitchen. albuterol (PROVENTIL HFA;VENTOLIN HFA) 108 (90 BASE) MCG/ACT inhaler Inhale 2 puffs into the lungs every 6 (six) hours as needed for wheezing. (Patient not taking: Reported on 08/22/2014) 8.5 g 6   No current facility-administered medications for this visit.    Objective: BP 140/82 mmHg  Pulse 76  Temp(Src) 97.9 F (36.6 C)  Wt 140 lb (63.504 kg) Gen: NAD, resting comfortably, appears younger than stated age CV: RRR no murmurs rubs or gallops Lungs: CTAB no crackles, wheeze, rhonchi Abdomen: soft/nontender/nondistended/normal bowel sounds.   Ext: trace edema Skin: warm, dry, no rash Neuro: grossly normal, moves all extremities, walks with cane  Assessment/Plan:  Essential hypertension Excellent control for age on valsartan 320mg  and carvedilol 25mg  BID. She does have COPD so if there is any worsening, may need to go back down on carvedilol and add another medication but hopeful she does well on regimen.    Return precautions advised. April follow up planned.   [facility] due to back pain/spinal stenosis. Anxiety provoking for patient. Check in with patient next visit.

## 2014-09-19 NOTE — Patient Instructions (Signed)
No changes today Blood pressure looks great See you in April unless you need me sooner

## 2014-09-19 NOTE — Assessment & Plan Note (Signed)
Excellent control for age on valsartan 320mg  and carvedilol 25mg  BID. She does have COPD so if there is any worsening, may need to go back down on carvedilol and add another medication but hopeful she does well on regimen.

## 2014-09-28 ENCOUNTER — Other Ambulatory Visit: Payer: Self-pay | Admitting: Internal Medicine

## 2014-10-03 ENCOUNTER — Ambulatory Visit: Payer: Medicare Other | Admitting: Critical Care Medicine

## 2014-10-30 ENCOUNTER — Emergency Department (HOSPITAL_COMMUNITY): Payer: Medicare Other

## 2014-10-30 ENCOUNTER — Other Ambulatory Visit (HOSPITAL_COMMUNITY): Payer: Self-pay

## 2014-10-30 ENCOUNTER — Encounter (HOSPITAL_COMMUNITY): Payer: Self-pay | Admitting: Emergency Medicine

## 2014-10-30 ENCOUNTER — Inpatient Hospital Stay (HOSPITAL_COMMUNITY)
Admission: EM | Admit: 2014-10-30 | Discharge: 2014-11-02 | DRG: 064 | Disposition: E | Payer: Medicare Other | Attending: Pulmonary Disease | Admitting: Pulmonary Disease

## 2014-10-30 DIAGNOSIS — R092 Respiratory arrest: Secondary | ICD-10-CM | POA: Diagnosis not present

## 2014-10-30 DIAGNOSIS — R4189 Other symptoms and signs involving cognitive functions and awareness: Secondary | ICD-10-CM | POA: Insufficient documentation

## 2014-10-30 DIAGNOSIS — I63312 Cerebral infarction due to thrombosis of left middle cerebral artery: Secondary | ICD-10-CM | POA: Diagnosis present

## 2014-10-30 DIAGNOSIS — I10 Essential (primary) hypertension: Secondary | ICD-10-CM | POA: Diagnosis present

## 2014-10-30 DIAGNOSIS — Z87891 Personal history of nicotine dependence: Secondary | ICD-10-CM

## 2014-10-30 DIAGNOSIS — I639 Cerebral infarction, unspecified: Secondary | ICD-10-CM | POA: Diagnosis not present

## 2014-10-30 DIAGNOSIS — H578 Other specified disorders of eye and adnexa: Secondary | ICD-10-CM | POA: Diagnosis present

## 2014-10-30 DIAGNOSIS — I6389 Other cerebral infarction: Secondary | ICD-10-CM

## 2014-10-30 DIAGNOSIS — J9600 Acute respiratory failure, unspecified whether with hypoxia or hypercapnia: Secondary | ICD-10-CM | POA: Diagnosis present

## 2014-10-30 DIAGNOSIS — G9340 Encephalopathy, unspecified: Secondary | ICD-10-CM | POA: Diagnosis present

## 2014-10-30 DIAGNOSIS — I611 Nontraumatic intracerebral hemorrhage in hemisphere, cortical: Secondary | ICD-10-CM | POA: Diagnosis present

## 2014-10-30 DIAGNOSIS — J9601 Acute respiratory failure with hypoxia: Secondary | ICD-10-CM | POA: Diagnosis not present

## 2014-10-30 DIAGNOSIS — Z8249 Family history of ischemic heart disease and other diseases of the circulatory system: Secondary | ICD-10-CM | POA: Diagnosis not present

## 2014-10-30 DIAGNOSIS — M109 Gout, unspecified: Secondary | ICD-10-CM | POA: Diagnosis present

## 2014-10-30 DIAGNOSIS — G936 Cerebral edema: Secondary | ICD-10-CM | POA: Diagnosis present

## 2014-10-30 DIAGNOSIS — J449 Chronic obstructive pulmonary disease, unspecified: Secondary | ICD-10-CM | POA: Diagnosis present

## 2014-10-30 DIAGNOSIS — Z66 Do not resuscitate: Secondary | ICD-10-CM | POA: Diagnosis present

## 2014-10-30 DIAGNOSIS — M353 Polymyalgia rheumatica: Secondary | ICD-10-CM | POA: Diagnosis present

## 2014-10-30 DIAGNOSIS — Z515 Encounter for palliative care: Secondary | ICD-10-CM | POA: Diagnosis not present

## 2014-10-30 DIAGNOSIS — Z888 Allergy status to other drugs, medicaments and biological substances status: Secondary | ICD-10-CM

## 2014-10-30 DIAGNOSIS — H18419 Arcus senilis, unspecified eye: Secondary | ICD-10-CM | POA: Diagnosis present

## 2014-10-30 DIAGNOSIS — Z79899 Other long term (current) drug therapy: Secondary | ICD-10-CM

## 2014-10-30 DIAGNOSIS — R404 Transient alteration of awareness: Secondary | ICD-10-CM

## 2014-10-30 DIAGNOSIS — I638 Other cerebral infarction: Secondary | ICD-10-CM | POA: Diagnosis not present

## 2014-10-30 DIAGNOSIS — M810 Age-related osteoporosis without current pathological fracture: Secondary | ICD-10-CM | POA: Diagnosis present

## 2014-10-30 DIAGNOSIS — I4891 Unspecified atrial fibrillation: Secondary | ICD-10-CM | POA: Diagnosis not present

## 2014-10-30 DIAGNOSIS — E785 Hyperlipidemia, unspecified: Secondary | ICD-10-CM | POA: Diagnosis present

## 2014-10-30 DIAGNOSIS — R4020 Unspecified coma: Secondary | ICD-10-CM | POA: Diagnosis not present

## 2014-10-30 DIAGNOSIS — Z886 Allergy status to analgesic agent status: Secondary | ICD-10-CM

## 2014-10-30 LAB — I-STAT CHEM 8, ED
BUN: 22 mg/dL (ref 6–23)
CHLORIDE: 109 mmol/L (ref 96–112)
Calcium, Ion: 1.22 mmol/L (ref 1.13–1.30)
Creatinine, Ser: 0.6 mg/dL (ref 0.50–1.10)
Glucose, Bld: 131 mg/dL — ABNORMAL HIGH (ref 70–99)
HEMATOCRIT: 44 % (ref 36.0–46.0)
Hemoglobin: 15 g/dL (ref 12.0–15.0)
Potassium: 3.9 mmol/L (ref 3.5–5.1)
SODIUM: 141 mmol/L (ref 135–145)
TCO2: 18 mmol/L (ref 0–100)

## 2014-10-30 LAB — COMPREHENSIVE METABOLIC PANEL
ALBUMIN: 4.2 g/dL (ref 3.5–5.2)
ALT: 30 U/L (ref 0–35)
AST: 39 U/L — ABNORMAL HIGH (ref 0–37)
Alkaline Phosphatase: 75 U/L (ref 39–117)
Anion gap: 6 (ref 5–15)
BILIRUBIN TOTAL: 1.4 mg/dL — AB (ref 0.3–1.2)
BUN: 20 mg/dL (ref 6–23)
CALCIUM: 9.3 mg/dL (ref 8.4–10.5)
CO2: 25 mmol/L (ref 19–32)
Chloride: 109 mmol/L (ref 96–112)
Creatinine, Ser: 0.75 mg/dL (ref 0.50–1.10)
GFR, EST AFRICAN AMERICAN: 83 mL/min — AB (ref >90)
GFR, EST NON AFRICAN AMERICAN: 71 mL/min — AB (ref >90)
GLUCOSE: 130 mg/dL — AB (ref 70–99)
Potassium: 3.9 mmol/L (ref 3.5–5.1)
SODIUM: 140 mmol/L (ref 135–145)
TOTAL PROTEIN: 7 g/dL (ref 6.0–8.3)

## 2014-10-30 LAB — I-STAT ARTERIAL BLOOD GAS, ED
Acid-base deficit: 9 mmol/L — ABNORMAL HIGH (ref 0.0–2.0)
Bicarbonate: 16.5 mEq/L — ABNORMAL LOW (ref 20.0–24.0)
O2 Saturation: 83 %
PH ART: 7.3 — AB (ref 7.350–7.450)
PO2 ART: 52 mmHg — AB (ref 80.0–100.0)
TCO2: 18 mmol/L (ref 0–100)
pCO2 arterial: 33.5 mmHg — ABNORMAL LOW (ref 35.0–45.0)

## 2014-10-30 LAB — RAPID URINE DRUG SCREEN, HOSP PERFORMED
AMPHETAMINES: NOT DETECTED
BARBITURATES: NOT DETECTED
Benzodiazepines: NOT DETECTED
Cocaine: NOT DETECTED
Opiates: NOT DETECTED
Tetrahydrocannabinol: NOT DETECTED

## 2014-10-30 LAB — GLUCOSE, CAPILLARY
GLUCOSE-CAPILLARY: 122 mg/dL — AB (ref 70–99)
Glucose-Capillary: 112 mg/dL — ABNORMAL HIGH (ref 70–99)

## 2014-10-30 LAB — CBC
HEMATOCRIT: 40.3 % (ref 36.0–46.0)
HEMOGLOBIN: 13.9 g/dL (ref 12.0–15.0)
MCH: 31.8 pg (ref 26.0–34.0)
MCHC: 34.5 g/dL (ref 30.0–36.0)
MCV: 92.2 fL (ref 78.0–100.0)
Platelets: 160 10*3/uL (ref 150–400)
RBC: 4.37 MIL/uL (ref 3.87–5.11)
RDW: 13.8 % (ref 11.5–15.5)
WBC: 13.2 10*3/uL — ABNORMAL HIGH (ref 4.0–10.5)

## 2014-10-30 LAB — URINALYSIS, ROUTINE W REFLEX MICROSCOPIC
BILIRUBIN URINE: NEGATIVE
GLUCOSE, UA: NEGATIVE mg/dL
Hgb urine dipstick: NEGATIVE
KETONES UR: 15 mg/dL — AB
LEUKOCYTES UA: NEGATIVE
NITRITE: NEGATIVE
PROTEIN: NEGATIVE mg/dL
Specific Gravity, Urine: 1.018 (ref 1.005–1.030)
Urobilinogen, UA: 0.2 mg/dL (ref 0.0–1.0)
pH: 5 (ref 5.0–8.0)

## 2014-10-30 LAB — TYPE AND SCREEN
ABO/RH(D): B POS
Antibody Screen: NEGATIVE

## 2014-10-30 LAB — PROTIME-INR
INR: 1.14 (ref 0.00–1.49)
PROTHROMBIN TIME: 14.8 s (ref 11.6–15.2)

## 2014-10-30 LAB — MRSA PCR SCREENING: MRSA by PCR: NEGATIVE

## 2014-10-30 LAB — CG4 I-STAT (LACTIC ACID): Lactic Acid, Venous: 1.21 mmol/L (ref 0.5–2.0)

## 2014-10-30 LAB — TROPONIN I: TROPONIN I: 0.03 ng/mL (ref <0.031)

## 2014-10-30 LAB — I-STAT TROPONIN, ED: Troponin i, poc: 0.02 ng/mL (ref 0.00–0.08)

## 2014-10-30 LAB — CK TOTAL AND CKMB (NOT AT ARMC)
CK TOTAL: 355 U/L — AB (ref 7–177)
CK, MB: 11.5 ng/mL (ref 0.3–4.0)
Relative Index: 3.2 — ABNORMAL HIGH (ref 0.0–2.5)

## 2014-10-30 LAB — I-STAT CG4 LACTIC ACID, ED: Lactic Acid, Venous: 1.2 mmol/L (ref 0.5–2.0)

## 2014-10-30 LAB — ETHANOL: Alcohol, Ethyl (B): <5 mg/dL (ref 0–9)

## 2014-10-30 LAB — ABO/RH: ABO/RH(D): B POS

## 2014-10-30 MED ORDER — MORPHINE SULFATE 4 MG/ML IJ SOLN
4.0000 mg | INTRAMUSCULAR | Status: DC | PRN
  Administered 2014-10-30: 4 mg via INTRAVENOUS
  Filled 2014-10-30: qty 1

## 2014-10-30 MED ORDER — MIDAZOLAM HCL 2 MG/2ML IJ SOLN
INTRAMUSCULAR | Status: AC
  Filled 2014-10-30: qty 2

## 2014-10-30 MED ORDER — ETOMIDATE 2 MG/ML IV SOLN
INTRAVENOUS | Status: AC | PRN
  Administered 2014-10-30: 20 mg via INTRAVENOUS

## 2014-10-30 MED ORDER — FENTANYL CITRATE 0.05 MG/ML IJ SOLN
INTRAMUSCULAR | Status: AC
  Filled 2014-10-30: qty 2

## 2014-10-30 MED ORDER — SODIUM CHLORIDE 0.9 % IV BOLUS (SEPSIS)
500.0000 mL | Freq: Once | INTRAVENOUS | Status: AC
  Administered 2014-10-30: 500 mL via INTRAVENOUS

## 2014-10-30 MED ORDER — FENTANYL CITRATE 0.05 MG/ML IJ SOLN
50.0000 ug | Freq: Once | INTRAMUSCULAR | Status: AC
  Administered 2014-10-30: 50 ug via INTRAVENOUS

## 2014-10-30 MED ORDER — SODIUM CHLORIDE 0.9 % IV SOLN
1.0000 mg/h | INTRAVENOUS | Status: DC
  Administered 2014-10-30 (×2): 2 mg/h via INTRAVENOUS
  Filled 2014-10-30: qty 10

## 2014-10-30 MED ORDER — MIDAZOLAM HCL 2 MG/2ML IJ SOLN
2.0000 mg | Freq: Once | INTRAMUSCULAR | Status: AC
  Administered 2014-10-30: 2 mg via INTRAVENOUS

## 2014-10-30 MED ORDER — PROPOFOL 10 MG/ML IV EMUL
INTRAVENOUS | Status: AC
  Filled 2014-10-30: qty 100

## 2014-10-30 MED ORDER — SUCCINYLCHOLINE CHLORIDE 20 MG/ML IJ SOLN
INTRAMUSCULAR | Status: AC | PRN
  Administered 2014-10-30: 120 mg via INTRAVENOUS

## 2014-10-30 MED ORDER — PROPOFOL 10 MG/ML IV EMUL
5.0000 ug/kg/min | INTRAVENOUS | Status: DC
  Administered 2014-10-30: 10 ug/kg/min via INTRAVENOUS

## 2014-10-30 MED ORDER — CETYLPYRIDINIUM CHLORIDE 0.05 % MT LIQD
7.0000 mL | Freq: Four times a day (QID) | OROMUCOSAL | Status: DC
  Administered 2014-10-31 – 2014-11-01 (×6): 7 mL via OROMUCOSAL

## 2014-10-30 MED ORDER — CHLORHEXIDINE GLUCONATE 0.12 % MT SOLN
15.0000 mL | Freq: Two times a day (BID) | OROMUCOSAL | Status: DC
  Administered 2014-10-30 – 2014-10-31 (×2): 15 mL via OROMUCOSAL
  Filled 2014-10-30: qty 15

## 2014-10-30 MED ORDER — PROPOFOL 10 MG/ML IV EMUL
5.0000 ug/kg/min | INTRAVENOUS | Status: DC
  Administered 2014-10-30 (×2): 30 ug/kg/min via INTRAVENOUS
  Filled 2014-10-30 (×2): qty 100

## 2014-10-30 NOTE — ED Notes (Signed)
Dr. Corrie Mckusickreynlods at bedside

## 2014-10-30 NOTE — ED Notes (Signed)
Call Coralee Northina [phone] with updates

## 2014-10-30 NOTE — ED Notes (Signed)
Patients yellow watch, dentures, and 4 bottles of medications, inhaler given to Coralee Northina, family member

## 2014-10-30 NOTE — ED Notes (Signed)
Spoke with Dr. Karma GanjaLinker about pt BP, orders to hang fluids and keep SBP above 90

## 2014-10-30 NOTE — ED Notes (Signed)
Per [facility], pt LSN 24 hours ago. Pt found by friend on floor facedown. Pt normally is AAOX4, lives alone, drives a car etc, pt has no right sided movement. Pt has left eye swelling and forehead swelling. Shortening in the L hip. Snoring respirations. BVM by ems. Capnography 30-32.

## 2014-10-30 NOTE — ED Provider Notes (Signed)
CSN: [id number]     Arrival date & time 10/02/2014  1419 History   First MD Initiated Contact with Patient 10/03/2014 1435     Chief Complaint  Patient presents with  . Cerebrovascular Accident     (Consider location/radiation/quality/duration/timing/severity/associated sxs/prior Treatment) HPI    A LEVEL 5 CAVEAT PERTAINS DUE TO PATIENT UNRESPONSIVE AND URGENT NEED FOR INTERVENTION.  Pt arrives with assistance with BVM via EMS.  She is unresponsive and not breathing.  Per EMS she was found at her home- a neighbor was worried because she didn't come to the door.  She was found on the floor.    Past Medical History  Diagnosis Date  . Allergy     rhinitis  . COPD (chronic obstructive pulmonary disease)     stage II Golds  . Hyperlipidemia   . Hypertension   . Osteoporosis   . Gout   . PMR (polymyalgia rheumatica)     cylic prednisone   Past Surgical History  Procedure Laterality Date  . Dilation and curettage of uterus    . Fractured arm      surgical repair  . Thyroglossal duct cyst     Family History  Problem Relation Age of Onset  . Heart disease Mother   . Diabetes Mother   . Cancer Father     lung   History  Substance Use Topics  . Smoking status: Former Smoker -- 0.80 packs/day for 26 years    Types: Cigarettes    Quit date: 08/03/1997  . Smokeless tobacco: Never Used  . Alcohol Use: 4.2 oz/week    7 Standard drinks or equivalent per week   OB History    No data available     Review of Systems  UNABLE TO OBTAIN ROS DUE TO LEVEL 5 CAVEAT    Allergies  Antihistamines, chlorpheniramine-type; Antihistamines, loratadine-type; Aspirin; Avelox; Benadryl; Colchicine; and Fosamax  Home Medications   Prior to Admission medications   Medication Sig Start Date End Date Taking? Authorizing Provider  allopurinol (ZYLOPRIM) 300 MG tablet TAKE 1 TABLET BY MOUTH DAILY 09/28/14  Yes Shelva MajesticStephen O Hunter, MD  BENICAR 40 MG tablet Take 40 mg by mouth daily. 07/25/14  Yes  Historical Provider, MD  carvedilol (COREG) 25 MG tablet Take 1 tablet (25 mg total) by mouth 2 (two) times daily with a meal. 09/07/14  Yes Shelva MajesticStephen O Hunter, MD  Fluticasone-Salmeterol (ADVAIR DISKUS) 250-50 MCG/DOSE AEPB INHALE 1 PUFF INTO THE LUNGS 2 (TWO) TIMES DAILY. 04/27/14  Yes Storm FriskPatrick E Cortopassi, MD  furosemide (LASIX) 20 MG tablet TAKE 1 TABLET (20 MG TOTAL) BY MOUTH DAILY. 03/21/14  Yes Baker PieriniPadonda B Campbell, FNP  Multiple Vitamins-Minerals (CENTRUM SILVER PO) Take by mouth daily.     Yes Historical Provider, MD  trolamine salicylate (ASPERCREME) 10 % cream Apply 1 application topically as needed for muscle pain.   Yes Historical Provider, MD  valsartan (DIOVAN) 320 MG tablet Take 1 tablet (320 mg total) by mouth daily. 08/22/14  Yes Shelva MajesticStephen O Hunter, MD  albuterol (PROVENTIL HFA;VENTOLIN HFA) 108 (90 BASE) MCG/ACT inhaler Inhale 2 puffs into the lungs every 6 (six) hours as needed for wheezing. Patient not taking: Reported on 08/22/2014 04/27/14   Storm FriskPatrick E Linskey, MD  carvedilol (COREG) 12.5 MG tablet Take 12.5 mg by mouth 2 (two) times daily with a meal. 08/23/14   Historical Provider, MD  VITAMIN D, CHOLECALCIFEROL, PO Take 1,000 Units by mouth daily.      Historical Provider, MD  BP 56/43 mmHg  Pulse 100  Temp(Src) 98.5 F (36.9 C) (Oral)  Resp 24  Ht  (1.575 m)  Wt 140 lb 3.4 oz (63.6 kg)  BMI 25.64 kg/m2  SpO2 72%  Vitals reviewed Physical Exam  Physical Examination: General appearance - unresponsive Mental status - unresponsive Eyes - pupils not responsive Chest - BBS with PPV, agonal respirations Heart - 2+ pulses femoral and carotid Neurological - unresponsive Extremities - peripheral pulses normal, no pedal edema, no clubbing or cyanosis Skin - no rashes  ED Course  Procedures (including critical care time)  CRITICAL CARE Performed by: Ethelda Chick Total critical care time: 40 Critical care time was exclusive of separately billable procedures and treating other  patients. Critical care was necessary to treat or prevent imminent or life-threatening deterioration. Critical care was time spent personally by me on the following activities: development of treatment plan with patient and/or surrogate as well as nursing, discussions with consultants, evaluation of patient's response to treatment, examination of patient, obtaining history from patient or surrogate, ordering and performing treatments and interventions, ordering and review of laboratory studies, ordering and review of radiographic studies, pulse oximetry and re-evaluation of patient's condition.  INTUBATION Performed by: Ethelda Chick  Required items: required blood products, implants, devices, and special equipment available Patient identity confirmed: provided demographic data and hospital-assigned identification number Time out: Immediately prior to procedure a "time out" was called to verify the correct patient, procedure, equipment, support staff and site/side marked as required.  Indications: respiratory arrest, unresponsive  Intubation method: Glidescope Laryngoscopy   Preoxygenation: BVM  Sedatives:  Etomidate Paralytic:  Succinylcholine  Tube Size: 7.5mg   cuffed  Post-procedure assessment: chest rise and ETCO2 monitor Breath sounds: equal and absent over the epigastrium Tube secured with: ETT holder Chest x-ray interpreted by radiologist and me.  Chest x-ray findings: endotracheal tube too low- pulled back to appropriate position  Patient tolerated the procedure well with no immediate complications.  2:56 PM d/w Dr. Molli Knock, PCCM. He will see patient in the ED.    3:43 PM ETT being pulled back by respiratory.  Discussed head CT findings with neurology.  They will see patient in the ED.     Labs Review Labs Reviewed  COMPREHENSIVE METABOLIC PANEL - Abnormal; Notable for the following:    Glucose, Bld 130 (*)    AST 39 (*)    Total Bilirubin 1.4 (*)    GFR  calc non Af Amer 71 (*)    GFR calc Af Amer 83 (*)    All other components within normal limits  CBC - Abnormal; Notable for the following:    WBC 13.2 (*)    All other components within normal limits  CK TOTAL AND CKMB - Abnormal; Notable for the following:    Total CK 355 (*)    CK, MB 11.5 (*)    Relative Index 3.2 (*)    All other components within normal limits  URINALYSIS, ROUTINE W REFLEX MICROSCOPIC - Abnormal; Notable for the following:    Ketones, ur 15 (*)    All other components within normal limits  GLUCOSE, CAPILLARY - Abnormal; Notable for the following:    Glucose-Capillary 122 (*)    All other components within normal limits  GLUCOSE, CAPILLARY - Abnormal; Notable for the following:    Glucose-Capillary 112 (*)    All other components within normal limits  BASIC METABOLIC PANEL - Abnormal; Notable for the following:    Chloride 113 (*)  Glucose, Bld 118 (*)    GFR calc non Af Amer 59 (*)    GFR calc Af Amer 69 (*)    All other components within normal limits  I-STAT CHEM 8, ED - Abnormal; Notable for the following:    Glucose, Bld 131 (*)    All other components within normal limits  I-STAT ARTERIAL BLOOD GAS, ED - Abnormal; Notable for the following:    pH, Arterial 7.300 (*)    pCO2 arterial 33.5 (*)    pO2, Arterial 52.0 (*)    Bicarbonate 16.5 (*)    Acid-base deficit 9.0 (*)    All other components within normal limits  URINE CULTURE  MRSA PCR SCREENING  ETHANOL  PROTIME-INR  TROPONIN I  URINE RAPID DRUG SCREEN (HOSP PERFORMED)  MAGNESIUM  I-STAT CG4 LACTIC ACID, ED  I-STAT TROPOININ, ED  I-STAT CG4 LACTIC ACID, ED  CG4 I-STAT (LACTIC ACID)  TYPE AND SCREEN  ABO/RH    Imaging Review No results found.   EKG Interpretation   Date/Time:  Tuesday October 30 2014 14:27:24 EDT Ventricular Rate:  89 PR Interval:  155 QRS Duration: 98 QT Interval:  391 QTC Calculation: 476 R Axis:   -54 Text Interpretation:  Sinus rhythm Atrial premature  complexes Probable  left atrial enlargement Left anterior fascicular block Abnormal R-wave  progression, early transition No significant change since last tracing  Confirmed by [facility]  MD, Deziyah Arvin [id number]) on 10/29/2014 2:40:07 PM      MDM   Final diagnoses:  Cerebral infarction due to other mechanism  Unresponsive  Respiratory arrest    Pt presenting with agonal respirations being bagged by EMS.  No DNR paperwork, EMS does not know of code status.  Pt intubated emergently, head CT shows large area of stroke.  Pt admitted to [facility], neurology consulted.  Daughter updated in the ED conference room.   Jerelyn Scott, MD 11/02/14 [phone]

## 2014-10-30 NOTE — ED Notes (Signed)
Family at bedside. 

## 2014-10-30 NOTE — ED Notes (Signed)
Pediatric aspen collar applied

## 2014-10-30 NOTE — ED Notes (Signed)
Pt returned to CT with this RN, no adverse events in CT

## 2014-10-30 NOTE — H&P (Signed)
PULMONARY / CRITICAL CARE MEDICINE   Name: [name] MRN: [id number] DOB: 20-May-1920    ADMISSION DATE:  10/28/2014 CONSULTATION DATE:  10/10/2014  REFERRING MD :  EDP  CHIEF COMPLAINT:  AMS  INITIAL PRESENTATION:  81 y.o. F brought to [facility] ED 3/29 with AMS after being found down.  CT revealed large left MCA infarct with reperfusion hemorrhage.  Pt intubated and PCCM called to admit.    During discussions with family, pt's daughter informed Dr. Molli Knock that pt was meant to be DNR / DNI.  She has requested that since pt is already intubated, that we continue mechanical ventilation until tomorrow morning when rest of family arrives at which point they will likely withdraw care.  Family flying in from out of town this PM.   STUDIES:  Pelvis XRay 3/29 >>> no fracture. CXR 3/29 9>>> right mainstem intubation.  Cardiomegaly with mild vascular congestion. CT head 3/29 >>> acute left MCA large vessel thrombosis / infarct with reperfusion type hemorrhage wich has extended into the left lateral ventricle.  Mild L to R shift of 3mm.   SIGNIFICANT EVENTS: 3/29 - admit   HISTORY OF PRESENT ILLNESS:  Pt is encephalopathic; therefore, this HPI is obtained from chart review. [name] is a 81 y.o. F with PMH as outlined below including COPD (followed by Dr. Delford Field as outpatient).  She was brought to [facility] ED 3/29 after [place].  She was last seen normal roughly 24 hours prior.  On ED arrival, she was noted to have no movement on right side but did have LUE and LLE movement though non-purposeful.  Due to airway compromise, she was intubated. CT of the head revealed a left parietal bleed  Of note, during discussions with family, pt's daughter informed Dr. Molli Knock that pt was meant to be DNR / DNI.  She has requested that since pt is already intubated, that we continue mechanical ventilation until tomorrow morning when rest of family arrives at which point they will  likely withdraw care.  Family flying in from out of town this PM.   PAST MEDICAL HISTORY :   has a past medical history of Allergy; COPD (chronic obstructive pulmonary disease); Hyperlipidemia; Hypertension; Osteoporosis; Gout; and PMR (polymyalgia rheumatica).  has past surgical history that includes Dilation and curettage of uterus; fractured arm; and Thyroglossal duct cyst. Prior to Admission medications   Medication Sig Start Date End Date Taking? Authorizing Provider  albuterol (PROVENTIL HFA;VENTOLIN HFA) 108 (90 BASE) MCG/ACT inhaler Inhale 2 puffs into the lungs every 6 (six) hours as needed for wheezing. Patient not taking: Reported on 08/22/2014 04/27/14   Storm Frisk, MD  allopurinol (ZYLOPRIM) 300 MG tablet TAKE 1 TABLET BY MOUTH DAILY 09/28/14   Shelva Majestic, MD  carvedilol (COREG) 25 MG tablet Take 1 tablet (25 mg total) by mouth 2 (two) times daily with a meal. 09/07/14   Shelva Majestic, MD  Fluticasone-Salmeterol (ADVAIR DISKUS) 250-50 MCG/DOSE AEPB INHALE 1 PUFF INTO THE LUNGS 2 (TWO) TIMES DAILY. 04/27/14   Storm Frisk, MD  furosemide (LASIX) 20 MG tablet TAKE 1 TABLET (20 MG TOTAL) BY MOUTH DAILY. 03/21/14   Baker Pierini, FNP  Multiple Vitamins-Minerals (CENTRUM SILVER PO) Take by mouth daily.      Historical Provider, MD  valsartan (DIOVAN) 320 MG tablet Take 1 tablet (320 mg total) by mouth daily. 08/22/14   Shelva Majestic, MD  VITAMIN D, CHOLECALCIFEROL, PO Take 1,000  Units by mouth daily.      Historical Provider, MD   Allergies  Allergen Reactions  . Aspirin     REACTION: reflux  . Avelox [Moxifloxacin Hcl In Nacl]     Tremors , shakes, nausea   . Colchicine     REACTION: myalgia  . Fosamax [Alendronate Sodium]     Nausea for 2 weeks    FAMILY HISTORY:  Family History  Problem Relation Age of Onset  . Heart disease Mother   . Diabetes Mother   . Cancer Father     lung    SOCIAL HISTORY:  reports that she quit smoking about 17 years ago.  Her smoking use included Cigarettes. She has a 20.8 pack-year smoking history. She has never used smokeless tobacco. She reports that she drinks about 4.2 oz of alcohol per week. She reports that she does not use illicit drugs.  REVIEW OF SYSTEMS:  Unable to obtain as pt is encephalopathic.  SUBJECTIVE:   VITAL SIGNS: Pulse Rate:  [44-97] 90 (03/29 1530) Resp:  [17-40] 40 (03/29 1530) BP: (122-191)/(61-88) 142/75 mmHg (03/29 1530) SpO2:  [85 %-100 %] 85 % (03/29 1530) FiO2 (%):  [100 %] 100 % (03/29 1451) HEMODYNAMICS:   VENTILATOR SETTINGS: Vent Mode:  [-] PRVC FiO2 (%):  [100 %] 100 % Set Rate:  [16 bmp] 16 bmp Vt Set:  [400 mL] 400 mL PEEP:  [5 cmH20] 5 cmH20 Plateau Pressure:  [10 cmH20] 10 cmH20 INTAKE / OUTPUT: Intake/Output    None     PHYSICAL EXAMINATION: General: Elderly female, in NAD. Neuro: Sedated on vent. Moving LUE and LLE non-purposefully.  No movement on right. HEENT: [place]/AT. PERRL, arcus senilis. Cardiovascular: RRR, no M/R/G.  Lungs: Respirations even and unlabored.  Coarse bilaterally. Abdomen: BS x 4, soft, NT/ND.  Musculoskeletal: No gross deformities, no edema.  Skin: Intact, warm, no rashes.  LABS:  CBC  Recent Labs Lab 10/19/2014 1430 10/15/2014 1441  WBC 13.2*  --   HGB 13.9 15.0  HCT 40.3 44.0  PLT 160  --    Coag's  Recent Labs Lab 10/27/2014 1430  INR 1.14   BMET  Recent Labs Lab 10/02/2014 1441  NA 141  K 3.9  CL 109  BUN 22  CREATININE 0.60  GLUCOSE 131*   Electrolytes No results for input(s): CALCIUM, MG, PHOS in the last 168 hours. Sepsis Markers  Recent Labs Lab 10/04/2014 1441  LATICACIDVEN 1.20   ABG No results for input(s): PHART, PCO2ART, PO2ART in the last 168 hours. Liver Enzymes No results for input(s): AST, ALT, ALKPHOS, BILITOT, ALBUMIN in the last 168 hours. Cardiac Enzymes No results for input(s): TROPONINI, PROBNP in the last 168 hours. Glucose No results for input(s): GLUCAP in the last 168  hours.  Imaging Ct Head Wo Contrast  10/07/2014   CLINICAL DATA:  Patient found down at home, currently unresponsive. Last seen normal time unknown.  EXAM: CT HEAD WITHOUT CONTRAST  CT CERVICAL SPINE WITHOUT CONTRAST  TECHNIQUE: Multidetector CT imaging of the head and cervical spine was performed following the standard protocol without intravenous contrast. Multiplanar CT image reconstructions of the cervical spine were also generated.  COMPARISON:  None.  FINDINGS: CT HEAD FINDINGS  There is hyperdense proximal LEFT MCA, indicating acute thrombosis of the LEFT middle cerebral artery in its M1 and M2 segments. This is accompanied by a large area of cytotoxic edema throughout much of the frontal, temporal, insular, and basal ganglia regions, consistent with acute infarction.  There is superimposed reperfusion type hemorrhage in the LEFT basal ganglia, with some LEFT lateral intraventricular extension. Slight left-to-right midline shift of 3 mm is noted.  Generalized atrophy. Chronic microvascular ischemic change. No hydrocephalus, or extra-axial fluid. Calvarium intact. No sinus or mastoid air fluid level. Suspected chronic BILATERAL ethmoid sinus disease. BILATERAL cataract extraction.  CT CERVICAL SPINE FINDINGS  There is no visible cervical spine fracture, traumatic subluxation, prevertebral soft tissue swelling, or intraspinal hematoma. Moderately advanced spondylosis, worst at C4-C5. Carotid atherosclerosis of the bifurcations. Thyroid goiter, greater on the LEFT, with early substernal extension. Incompletely evaluated RIGHT pleural effusion.  IMPRESSION: Acute LEFT MCA large vessel thrombosis with a significant area of acute infarction involving much of the LEFT MCA territory. More centrally within the basal ganglia, there is reperfusion type hemorrhage which has extended into the LEFT lateral ventricle. Mild left-to-right shift approximately 3 mm.  Cervical spondylosis without fracture or traumatic  subluxation.  Carotid atherosclerosis.  Findings discussed with ordering provider at time of interpretation, 3:35 p.m.   Electronically Signed   By: Davonna Belling M.D.   On: 11-20-2014 15:44   Ct Cervical Spine Wo Contrast  11/20/14   CLINICAL DATA:  Patient found down at home, currently unresponsive. Last seen normal time unknown.  EXAM: CT HEAD WITHOUT CONTRAST  CT CERVICAL SPINE WITHOUT CONTRAST  TECHNIQUE: Multidetector CT imaging of the head and cervical spine was performed following the standard protocol without intravenous contrast. Multiplanar CT image reconstructions of the cervical spine were also generated.  COMPARISON:  None.  FINDINGS: CT HEAD FINDINGS  There is hyperdense proximal LEFT MCA, indicating acute thrombosis of the LEFT middle cerebral artery in its M1 and M2 segments. This is accompanied by a large area of cytotoxic edema throughout much of the frontal, temporal, insular, and basal ganglia regions, consistent with acute infarction. There is superimposed reperfusion type hemorrhage in the LEFT basal ganglia, with some LEFT lateral intraventricular extension. Slight left-to-right midline shift of 3 mm is noted.  Generalized atrophy. Chronic microvascular ischemic change. No hydrocephalus, or extra-axial fluid. Calvarium intact. No sinus or mastoid air fluid level. Suspected chronic BILATERAL ethmoid sinus disease. BILATERAL cataract extraction.  CT CERVICAL SPINE FINDINGS  There is no visible cervical spine fracture, traumatic subluxation, prevertebral soft tissue swelling, or intraspinal hematoma. Moderately advanced spondylosis, worst at C4-C5. Carotid atherosclerosis of the bifurcations. Thyroid goiter, greater on the LEFT, with early substernal extension. Incompletely evaluated RIGHT pleural effusion.  IMPRESSION: Acute LEFT MCA large vessel thrombosis with a significant area of acute infarction involving much of the LEFT MCA territory. More centrally within the basal ganglia, there is  reperfusion type hemorrhage which has extended into the LEFT lateral ventricle. Mild left-to-right shift approximately 3 mm.  Cervical spondylosis without fracture or traumatic subluxation.  Carotid atherosclerosis.  Findings discussed with ordering provider at time of interpretation, 3:35 p.m.   Electronically Signed   By: Davonna Belling M.D.   On: 11/20/14 15:44   Dg Pelvis Portable  Nov 20, 2014   CLINICAL DATA:  Fall.  Found unresponsive.  Initial encounter.  EXAM: PORTABLE PELVIS 1-2 VIEWS  COMPARISON:  10/19/2006 lumbar spine exam.  FINDINGS: No fracture noted.  Evaluation of sacrum slightly limited by bowel.  If the patient turns out to have hip pain than cone-down series of the hips recommended.  Degenerative changes L4-5 level.  Vascular calcifications.  IMPRESSION: No fracture.  Please see above.   Electronically Signed   By: Lacy Duverney M.D.   On: 11-20-2014  15:24   Dg Chest Portable 1 View  10/09/2014   CLINICAL DATA:  81 year old female with repositioning of endotracheal tube. Subsequent encounter.  EXAM: PORTABLE CHEST - 1 VIEW  COMPARISON:  10/25/2014 2:55 p.m.  FINDINGS: Endotracheal tube has been retracted. The tip remains just above the carina. Recommend retracting by 2 cm to avoid mainstem bronchus intubation with change in patient's neck position.  Nasogastric tube courses below the diaphragm. Tip is not included on the present exam.  Left base subsegmental atelectasis.  Pulmonary vascular prominence most notable centrally.  No gross pneumothorax.  Calcified tortuous aorta.  Calcified carotid bifurcation bilaterally.  Heart size top-normal.  IMPRESSION: Endotracheal tube has been retracted. The tip remains just above the carina. Recommend retracting by 2 cm to avoid mainstem bronchus intubation with change in patient's neck position.  Please see above.   Electronically Signed   By: Lacy Duverney M.D.   On: 10/08/2014 15:59   Dg Chest Portable 1 View  10/19/2014   CLINICAL DATA:  Fall,  ETT placement.  EXAM: PORTABLE CHEST - 1 VIEW  COMPARISON:  Chest radiograph 09/19/2012.  FINDINGS: Cardiomegaly.  Mild vascular congestion.  No pneumothorax.  ET tube has been inserted and lies in the RIGHT mainstem bronchus. It should be withdrawn 5-6 cm.  Calcified tortuous aorta. No significant pleural effusion. No definite acute rib fractures. Chronic LEFT 8th rib fracture with callus,  IMPRESSION: Cardiomegaly with mild vascular congestion.  ETT too low, in RIGHT mainstem bronchus, should be withdrawn 5-6 cm ; I discussed the findings with the ordering provider.   Electronically Signed   By: Davonna Belling M.D.   On: 10/08/2014 15:24     ASSESSMENT / PLAN:  PULMONARY OETT 3/29 >>> A: Acute respiratory failure following acute ischemic left MCA infarct with reperfusion hemorrhage COPD P:   Full mechanical support overnight. Family flying in from out of town.  Will be withdrawing tomorrow morning once family arrived and at bedside.  CARDIOVASCULAR A:  Hx HTN, HLD DNR Status P:  Monitor hemodynamics. No CVL's or pressors. If becomes hypotensive, d/c propofol and switch to fentanyl.   Can use IVF boluses as needed to maintain SBP > 90.  NEUROLOGIC A:   Acute left MCA infarct with reperfusion type hemorrhage which has extended into the left lateral ventricle.  Mild L to R shift of 3mm. P:   Sedation:  Propofol gtt. RASS goal: 0 to -1.  Pt was meant to be DNR / DNI; therefore, we will not order any blood draws or further imaging.  Pt's family members from out of town are flying in this afternoon and will be at hospital tomorrow morning.  Once family arrives and are ready, they will withdraw (at some point tomorrow morning 3/30).   Family updated: Daughter by Dr. Molli Knock.  Interdisciplinary Family Meeting v Palliative Care Meeting:  Due by:  N/A - family withdrawing in AM 3/30.   Rutherford Guys, [place] [facility] Pager: [phone]  or [phone] 10/23/2014, 3:37 PM  Spoke with daughter extensively, patient is not protecting her airway, concern for post CVA.  The daughter is clear that patient would not want this level of care and was DNR prior to this.  Unless can return back to perfectly normal would not want to be alive.  Discussed with neuro, patient has no chance of going back to normal. When daughter was told that patient will not go  back to normal she asked that we maintain on life support until the rest of the family arrives in AM then will proceed with withdrawal.  Will keep comfortable til then.  The patient is critically ill with multiple organ systems failure and requires high complexity decision making for assessment and support, frequent evaluation and titration of therapies, application of advanced monitoring technologies and extensive interpretation of multiple databases.   Critical Care Time devoted to patient care services described in this note is  45  Minutes. This time reflects time of care of this signee Dr Koren Bound. This critical care time does not reflect procedure time, or teaching time or supervisory time of PA/NP/Med student/Med Resident etc but could involve care discussion time.  Alyson Reedy, M.D. [facility] Pulmonary/Critical Care Medicine. Pager: [phone]. After hours pager: [phone].

## 2014-10-30 NOTE — ED Notes (Signed)
X-ray at bedside

## 2014-10-30 NOTE — Code Documentation (Signed)
Dr. Karma GanjaLinker attempting Intubation.

## 2014-10-30 NOTE — Consult Note (Addendum)
Referring Physician: Linker    Chief Complaint: Unresponsive  HPI: Isabel GrillsCharlotte [name] is an 81 y.o. female with an unclear LKW.  It seems that the patient was last seen sometime yesterday but may have been spoken to on the phone last night.  Today a neighbor became concerned because she did not answer the door.  EMS was called and the patient was found on the floor face down.  There was forehead and left eye swelling.  Respirations were sonorous.  Patient was brought to the ED and due to inability to control her airway the patient was intubated.    Date last known well: Date: 10/29/2014 Time last known well: Unable to determine tPA Given: No: Unable to determine LKW, hemorrhage on CT  Past Medical History  Diagnosis Date  . Allergy     rhinitis  . COPD (chronic obstructive pulmonary disease)     stage II Golds  . Hyperlipidemia   . Hypertension   . Osteoporosis   . Gout   . PMR (polymyalgia rheumatica)     cylic prednisone    Past Surgical History  Procedure Laterality Date  . Dilation and curettage of uterus    . Fractured arm      surgical repair  . Thyroglossal duct cyst      Family History  Problem Relation Age of Onset  . Heart disease Mother   . Diabetes Mother   . Cancer Father     lung   Social History:  reports that she quit smoking about 17 years ago. Her smoking use included Cigarettes. She has a 20.8 pack-year smoking history. She has never used smokeless tobacco. She reports that she drinks about 4.2 oz of alcohol per week. She reports that she does not use illicit drugs.  Allergies:  Allergies  Allergen Reactions  . Aspirin     REACTION: reflux  . Avelox [Moxifloxacin Hcl In Nacl]     Tremors , shakes, nausea   . Colchicine     REACTION: myalgia  . Fosamax [Alendronate Sodium]     Nausea for 2 weeks    Medications: I have reviewed the patient's current medications. Prior to Admission:  Current outpatient prescriptions:  .  albuterol (PROVENTIL  HFA;VENTOLIN HFA) 108 (90 BASE) MCG/ACT inhaler, Inhale 2 puffs into the lungs every 6 (six) hours as needed for wheezing. (Patient not taking: Reported on 08/22/2014), Disp: 8.5 g, Rfl: 6 .  allopurinol (ZYLOPRIM) 300 MG tablet, TAKE 1 TABLET BY MOUTH DAILY, Disp: 30 tablet, Rfl: 11 .  carvedilol (COREG) 25 MG tablet, Take 1 tablet (25 mg total) by mouth 2 (two) times daily with a meal., Disp: 60 tablet, Rfl: 5 .  Fluticasone-Salmeterol (ADVAIR DISKUS) 250-50 MCG/DOSE AEPB, INHALE 1 PUFF INTO THE LUNGS 2 (TWO) TIMES DAILY., Disp: 60 each, Rfl: 11 .  furosemide (LASIX) 20 MG tablet, TAKE 1 TABLET (20 MG TOTAL) BY MOUTH DAILY., Disp: 90 tablet, Rfl: 2 .  Multiple Vitamins-Minerals (CENTRUM SILVER PO), Take by mouth daily.  , Disp: , Rfl:  .  valsartan (DIOVAN) 320 MG tablet, Take 1 tablet (320 mg total) by mouth daily., Disp: 30 tablet, Rfl: 5 .  VITAMIN D, CHOLECALCIFEROL, PO, Take 1,000 Units by mouth daily.  , Disp: , Rfl:   ROS: Unable to obtain  Physical Examination: Blood pressure 154/77, pulse 94, temperature 98.1 F (36.7 C), resp. rate 24, height 5\' 2"  (1.575 m), SpO2 96 %.  HEENT-  Normocephalic, ecchymosis right side of face.  Normal  external eye and conjunctiva.  Normal TM's bilaterally.  Normal auditory canals and external ears. Normal external nose, mucus membranes and septum.  Normal pharynx. Cardiovascular- S1, S2 normal, pulses palpable throughout   Lungs- chest clear, no wheezing, rales, normal symmetric air entry Abdomen- soft, non-tender; bowel sounds normal; no masses,  no organomegaly Extremities- no edema Lymph-no adenopathy palpable Musculoskeletal-no joint tenderness, deformity or swelling Skin-multiple areas of ecchymosis  Neurological Examination Mental Status: Intubated on Propofol.  Patient does not respond to verbal stimuli.  Some decerebrate posturing of the LUE with deep sternal rub.  Does not follow commands.  No verbalizations noted.  Cranial Nerves: II:  patient does not respond confrontation bilaterally, pupils right 2 mm, left 2 mm,and unreactive bilaterally III,IV,VI: Full doll's response absent bilaterally. Eyes can be deviated to the left but will not go beyond midline to the right.   V,VII: corneal reflex absent on the right  VIII: patient does not respond to verbal stimuli IX,X: gag reflex reduced, XI: trapezius strength unable to test bilaterally XII: tongue strength unable to test Motor: Movement noted in the LUE that is in response to pain and will lift the arm off the bed.  Moves the LLE but does not lift off the bed, does flex at the knee.  Withdrawal noted of the RLE.  No movement noted of the RUE.  Increased tone of the RLE.   Sensory: Does not respond to noxious stimuli in the RUE.  Flexion response with painful stimuli to the RLE.  Extensive movement noted with painful stimuli to the LUE and LLE Deep Tendon Reflexes:  2+ throughout with absent AJ's bilaterally. Plantars: upgoing on the right, mute on the left Cerebellar: Unable to perform   Laboratory Studies:  Basic Metabolic Panel:  Recent Labs Lab 11/21/14 1430 November 21, 2014 1441  NA 140 141  K 3.9 3.9  CL 109 109  CO2 25  --   GLUCOSE 130* 131*  BUN 20 22  CREATININE 0.75 0.60  CALCIUM 9.3  --     Liver Function Tests:  Recent Labs Lab 11-21-14 1430  AST 39*  ALT 30  ALKPHOS 75  BILITOT 1.4*  PROT 7.0  ALBUMIN 4.2   No results for input(s): LIPASE, AMYLASE in the last 168 hours. No results for input(s): AMMONIA in the last 168 hours.  CBC:  Recent Labs Lab 11/21/14 1430 November 21, 2014 1441  WBC 13.2*  --   HGB 13.9 15.0  HCT 40.3 44.0  MCV 92.2  --   PLT 160  --     Cardiac Enzymes:  Recent Labs Lab November 21, 2014 1430  CKTOTAL 355*  CKMB 11.5*  TROPONINI 0.03    BNP: Invalid input(s): POCBNP  CBG: No results for input(s): GLUCAP in the last 168 hours.  Microbiology: No results found for this or any previous visit.  Coagulation  Studies:  Recent Labs  11-21-14 1430  LABPROT 14.8  INR 1.14    Urinalysis: No results for input(s): COLORURINE, LABSPEC, PHURINE, GLUCOSEU, HGBUR, BILIRUBINUR, KETONESUR, PROTEINUR, UROBILINOGEN, NITRITE, LEUKOCYTESUR in the last 168 hours.  Invalid input(s): APPERANCEUR  Lipid Panel:    Component Value Date/Time   CHOL 207* 09/14/2007 1006   TRIG 111 09/14/2007 1006   TRIG 103 05/20/2006 1324   HDL 54.0 09/14/2007 1006   CHOLHDL 3.8 CALC 09/14/2007 1006   CHOLHDL 5.3 CALC 05/20/2006 1324   VLDL 22 09/14/2007 1006    ZOXW9U: No results found for: HGBA1C  Urine Drug Screen:  No results found  for: LABOPIA, COCAINSCRNUR, LABBENZ, AMPHETMU, THCU, LABBARB  Alcohol Level:  Recent Labs Lab Nov 03, 2014 1430  ETH <5    Other results: EKG: sinus rhythm at 89 bpm.  Imaging: Ct Head Wo Contrast  11/03/2014   CLINICAL DATA:  Patient found down at home, currently unresponsive. Last seen normal time unknown.  EXAM: CT HEAD WITHOUT CONTRAST  CT CERVICAL SPINE WITHOUT CONTRAST  TECHNIQUE: Multidetector CT imaging of the head and cervical spine was performed following the standard protocol without intravenous contrast. Multiplanar CT image reconstructions of the cervical spine were also generated.  COMPARISON:  None.  FINDINGS: CT HEAD FINDINGS  There is hyperdense proximal LEFT MCA, indicating acute thrombosis of the LEFT middle cerebral artery in its M1 and M2 segments. This is accompanied by a large area of cytotoxic edema throughout much of the frontal, temporal, insular, and basal ganglia regions, consistent with acute infarction. There is superimposed reperfusion type hemorrhage in the LEFT basal ganglia, with some LEFT lateral intraventricular extension. Slight left-to-right midline shift of 3 mm is noted.  Generalized atrophy. Chronic microvascular ischemic change. No hydrocephalus, or extra-axial fluid. Calvarium intact. No sinus or mastoid air fluid level. Suspected chronic BILATERAL  ethmoid sinus disease. BILATERAL cataract extraction.  CT CERVICAL SPINE FINDINGS  There is no visible cervical spine fracture, traumatic subluxation, prevertebral soft tissue swelling, or intraspinal hematoma. Moderately advanced spondylosis, worst at C4-C5. Carotid atherosclerosis of the bifurcations. Thyroid goiter, greater on the LEFT, with early substernal extension. Incompletely evaluated RIGHT pleural effusion.  IMPRESSION: Acute LEFT MCA large vessel thrombosis with a significant area of acute infarction involving much of the LEFT MCA territory. More centrally within the basal ganglia, there is reperfusion type hemorrhage which has extended into the LEFT lateral ventricle. Mild left-to-right shift approximately 3 mm.  Cervical spondylosis without fracture or traumatic subluxation.  Carotid atherosclerosis.  Findings discussed with ordering provider at time of interpretation, 3:35 p.m.   Electronically Signed   By: Davonna Belling M.D.   On: 03-Nov-2014 15:44   Ct Cervical Spine Wo Contrast  11/03/14   CLINICAL DATA:  Patient found down at home, currently unresponsive. Last seen normal time unknown.  EXAM: CT HEAD WITHOUT CONTRAST  CT CERVICAL SPINE WITHOUT CONTRAST  TECHNIQUE: Multidetector CT imaging of the head and cervical spine was performed following the standard protocol without intravenous contrast. Multiplanar CT image reconstructions of the cervical spine were also generated.  COMPARISON:  None.  FINDINGS: CT HEAD FINDINGS  There is hyperdense proximal LEFT MCA, indicating acute thrombosis of the LEFT middle cerebral artery in its M1 and M2 segments. This is accompanied by a large area of cytotoxic edema throughout much of the frontal, temporal, insular, and basal ganglia regions, consistent with acute infarction. There is superimposed reperfusion type hemorrhage in the LEFT basal ganglia, with some LEFT lateral intraventricular extension. Slight left-to-right midline shift of 3 mm is noted.   Generalized atrophy. Chronic microvascular ischemic change. No hydrocephalus, or extra-axial fluid. Calvarium intact. No sinus or mastoid air fluid level. Suspected chronic BILATERAL ethmoid sinus disease. BILATERAL cataract extraction.  CT CERVICAL SPINE FINDINGS  There is no visible cervical spine fracture, traumatic subluxation, prevertebral soft tissue swelling, or intraspinal hematoma. Moderately advanced spondylosis, worst at C4-C5. Carotid atherosclerosis of the bifurcations. Thyroid goiter, greater on the LEFT, with early substernal extension. Incompletely evaluated RIGHT pleural effusion.  IMPRESSION: Acute LEFT MCA large vessel thrombosis with a significant area of acute infarction involving much of the LEFT MCA territory. More centrally within  the basal ganglia, there is reperfusion type hemorrhage which has extended into the LEFT lateral ventricle. Mild left-to-right shift approximately 3 mm.  Cervical spondylosis without fracture or traumatic subluxation.  Carotid atherosclerosis.  Findings discussed with ordering provider at time of interpretation, 3:35 p.m.   Electronically Signed   By: Davonna Belling M.D.   On: 11-26-2014 15:44   Dg Pelvis Portable  November 26, 2014   CLINICAL DATA:  Fall.  Found unresponsive.  Initial encounter.  EXAM: PORTABLE PELVIS 1-2 VIEWS  COMPARISON:  10/19/2006 lumbar spine exam.  FINDINGS: No fracture noted.  Evaluation of sacrum slightly limited by bowel.  If the patient turns out to have hip pain than cone-down series of the hips recommended.  Degenerative changes L4-5 level.  Vascular calcifications.  IMPRESSION: No fracture.  Please see above.   Electronically Signed   By: Lacy Duverney M.D.   On: 11-26-14 15:24   Dg Chest Portable 1 View  Nov 26, 2014   CLINICAL DATA:  Fall, ETT placement.  EXAM: PORTABLE CHEST - 1 VIEW  COMPARISON:  Chest radiograph 09/19/2012.  FINDINGS: Cardiomegaly.  Mild vascular congestion.  No pneumothorax.  ET tube has been inserted and lies in  the RIGHT mainstem bronchus. It should be withdrawn 5-6 cm.  Calcified tortuous aorta. No significant pleural effusion. No definite acute rib fractures. Chronic LEFT 8th rib fracture with callus,  IMPRESSION: Cardiomegaly with mild vascular congestion.  ETT too low, in RIGHT mainstem bronchus, should be withdrawn 5-6 cm ; I discussed the findings with the ordering provider.   Electronically Signed   By: Davonna Belling M.D.   On: 11/26/14 15:24    Assessment: 81 y.o. female found down, unresponsive.  Head CT personally reviewed and shows a large left MCA territory area of hypodensity consistent with a large left MCA infarct with hemorrhage noted in the basal ganglia.  Hyperdense left M1 also noted.  Patient had previously made the decision to be made a DNR.  Staff not made aware until patient intubated.  Patient will not be ablet o return to her previous level of functioning.  With that in mid the family does not wish for aggressive management.    Stroke Risk Factors - hyperlipidemia and hypertension  Plan: 1. Comfort measures  Case discussed with critical care and family.     Thana Farr, MD [facility] [phone] 11/26/2014, 4:00 PM

## 2014-10-30 NOTE — ED Notes (Signed)
Respiratory pulled back ETT 2cm. 17 at the lip at this time.

## 2014-10-31 ENCOUNTER — Ambulatory Visit: Payer: Medicare Other | Admitting: Critical Care Medicine

## 2014-10-31 DIAGNOSIS — Z66 Do not resuscitate: Secondary | ICD-10-CM

## 2014-10-31 DIAGNOSIS — I639 Cerebral infarction, unspecified: Secondary | ICD-10-CM

## 2014-10-31 LAB — BASIC METABOLIC PANEL
ANION GAP: 6 (ref 5–15)
BUN: 17 mg/dL (ref 6–23)
CALCIUM: 8.5 mg/dL (ref 8.4–10.5)
CHLORIDE: 113 mmol/L — AB (ref 96–112)
CO2: 23 mmol/L (ref 19–32)
CREATININE: 0.83 mg/dL (ref 0.50–1.10)
GFR calc Af Amer: 69 mL/min — ABNORMAL LOW (ref >90)
GFR, EST NON AFRICAN AMERICAN: 59 mL/min — AB (ref >90)
Glucose, Bld: 118 mg/dL — ABNORMAL HIGH (ref 70–99)
Potassium: 3.5 mmol/L (ref 3.5–5.1)
Sodium: 142 mmol/L (ref 135–145)

## 2014-10-31 LAB — URINE CULTURE
COLONY COUNT: NO GROWTH
Culture: NO GROWTH

## 2014-10-31 LAB — MAGNESIUM: MAGNESIUM: 1.7 mg/dL (ref 1.5–2.5)

## 2014-10-31 MED ORDER — AMIODARONE LOAD VIA INFUSION
150.0000 mg | Freq: Once | INTRAVENOUS | Status: AC
  Administered 2014-10-31: 150 mg via INTRAVENOUS
  Filled 2014-10-31: qty 83.34

## 2014-10-31 MED ORDER — AMIODARONE HCL IN DEXTROSE 360-4.14 MG/200ML-% IV SOLN
60.0000 mg/h | INTRAVENOUS | Status: DC
  Administered 2014-10-31 (×2): 60 mg/h via INTRAVENOUS
  Filled 2014-10-31: qty 200

## 2014-10-31 MED ORDER — AMIODARONE HCL IN DEXTROSE 360-4.14 MG/200ML-% IV SOLN
30.0000 mg/h | INTRAVENOUS | Status: DC
  Filled 2014-10-31: qty 200

## 2014-10-31 MED ORDER — ACETAMINOPHEN 160 MG/5ML PO SOLN
650.0000 mg | Freq: Four times a day (QID) | ORAL | Status: DC | PRN
  Administered 2014-10-31: 650 mg via ORAL
  Filled 2014-10-31: qty 20.3

## 2014-10-31 NOTE — Progress Notes (Signed)
Subjective: No change overnight.  On Propofol and sedation.  Objective: Current vital signs: BP 75/28 mmHg  Pulse 97  Temp(Src) 98.7 F (37.1 C) (Axillary)  Resp 16  Ht  (1.575 m)  Wt 64.9 kg (143 lb 1.3 oz)  BMI 26.16 kg/m2  SpO2 98% Vital signs in last 24 hours: Temp:  [98.3 F (36.8 C)-101.6 F (38.7 C)] 98.7 F (37.1 C) (03/30 1514) Pulse Rate:  [50-134] 97 (03/30 1615) Resp:  [15-27] 16 (03/30 1615) BP: (62-151)/(28-75) 75/28 mmHg (03/30 1615) SpO2:  [94 %-100 %] 98 % (03/30 1615) FiO2 (%):  [50 %-100 %] 50 % (03/30 1615) Weight:  [63.4 kg (139 lb 12.4 oz)-64.9 kg (143 lb 1.3 oz)] 64.9 kg (143 lb 1.3 oz) (03/30 0304)  Intake/Output from previous day: 03/29 0701 - 03/30 0700 In: 393.8 [I.V.:393.8] Out: 885 [Urine:885] Intake/Output this shift: Total I/O In: 19.8 [I.V.:19.8] Out: 0  Nutritional status: Diet NPO time specified  Neurologic Exam: Mental Status: Patient does not respond to verbal stimuli.  Does not respond to deep sternal rub.  Does not follow commands.  No verbalizations noted.  Cranial Nerves: II: patient does not respond confrontation bilaterally, pupils right 2 mm, left 2 mm,and unreactive bilaterally III,IV,VI: doll's response absent bilaterally. V,VII: corneal reflex absent on the right  VIII: patient does not respond to verbal stimuli IX,X: gag reflex absent, XI: trapezius strength unable to test bilaterally XII: tongue strength unable to test Motor: Extremities flaccid throughout.  No spontaneous movement noted.  No purposeful movements noted. Sensory: Does not respond to noxious stimuli in any extremity. Deep Tendon Reflexes:  2+ throughout with absent AJ's bilaterally Plantars: upgoing on the right Cerebellar: Unable to perform    Lab Results: Basic Metabolic Panel:  Recent Labs Lab 11/24/2014 1430 November 24, 2014 1441 10/31/14 0239  NA 140 141 142  K 3.9 3.9 3.5  CL 109 109 113*  CO2 25  --  23  GLUCOSE 130* 131* 118*  BUN  CREATININE 0.75 0.60 0.83  CALCIUM 9.3  --  8.5  MG  --   --  1.7    Liver Function Tests:  Recent Labs Lab 11-24-14 1430  AST 39*  ALT 30  ALKPHOS 75  BILITOT 1.4*  PROT 7.0  ALBUMIN 4.2   No results for input(s): LIPASE, AMYLASE in the last 168 hours. No results for input(s): AMMONIA in the last 168 hours.  CBC:  Recent Labs Lab 2014/11/24 1430 2014-11-24 1441  WBC 13.2*  --   HGB 13.9 15.0  HCT 40.3 44.0  MCV 92.2  --   PLT 160  --     Cardiac Enzymes:  Recent Labs Lab 11/24/14 1430  CKTOTAL 355*  CKMB 11.5*  TROPONINI 0.03    Lipid Panel: No results for input(s): CHOL, TRIG, HDL, CHOLHDL, VLDL, LDLCALC in the last 168 hours.  CBG:  Recent Labs Lab 11-24-2014 1759 11-24-14 1923  GLUCAP 122* 112*    Microbiology: Results for orders placed or performed during the hospital encounter of 2014/11/24  MRSA PCR Screening     Status: None   Collection Time: 2014-11-24  6:15 PM  Result Value Ref Range Status   MRSA by PCR NEGATIVE NEGATIVE Final    Comment:        The GeneXpert MRSA Assay (FDA approved for NASAL specimens only), is one component of a comprehensive MRSA colonization surveillance program. It is not intended to diagnose MRSA infection nor to guide or monitor  treatment for MRSA infections.     Coagulation Studies:  Recent Labs  17-May-2015 1430  LABPROT 14.8  INR 1.14    Imaging: Ct Head Wo Contrast  Jun 09, 2015   CLINICAL DATA:  Patient found down at home, currently unresponsive. Last seen normal time unknown.  EXAM: CT HEAD WITHOUT CONTRAST  CT CERVICAL SPINE WITHOUT CONTRAST  TECHNIQUE: Multidetector CT imaging of the head and cervical spine was performed following the standard protocol without intravenous contrast. Multiplanar CT image reconstructions of the cervical spine were also generated.  COMPARISON:  None.  FINDINGS: CT HEAD FINDINGS  There is hyperdense proximal LEFT MCA, indicating acute thrombosis of the LEFT middle  cerebral artery in its M1 and M2 segments. This is accompanied by a large area of cytotoxic edema throughout much of the frontal, temporal, insular, and basal ganglia regions, consistent with acute infarction. There is superimposed reperfusion type hemorrhage in the LEFT basal ganglia, with some LEFT lateral intraventricular extension. Slight left-to-right midline shift of 3 mm is noted.  Generalized atrophy. Chronic microvascular ischemic change. No hydrocephalus, or extra-axial fluid. Calvarium intact. No sinus or mastoid air fluid level. Suspected chronic BILATERAL ethmoid sinus disease. BILATERAL cataract extraction.  CT CERVICAL SPINE FINDINGS  There is no visible cervical spine fracture, traumatic subluxation, prevertebral soft tissue swelling, or intraspinal hematoma. Moderately advanced spondylosis, worst at C4-C5. Carotid atherosclerosis of the bifurcations. Thyroid goiter, greater on the LEFT, with early substernal extension. Incompletely evaluated RIGHT pleural effusion.  IMPRESSION: Acute LEFT MCA large vessel thrombosis with a significant area of acute infarction involving much of the LEFT MCA territory. More centrally within the basal ganglia, there is reperfusion type hemorrhage which has extended into the LEFT lateral ventricle. Mild left-to-right shift approximately 3 mm.  Cervical spondylosis without fracture or traumatic subluxation.  Carotid atherosclerosis.  Findings discussed with ordering provider at time of interpretation, 3:35 p.m.   Electronically Signed   By: Davonna BellingJohn  Curnes M.D.   On: 0Nov 06, 2016 15:44   Ct Cervical Spine Wo Contrast  Jun 09, 2015   CLINICAL DATA:  Patient found down at home, currently unresponsive. Last seen normal time unknown.  EXAM: CT HEAD WITHOUT CONTRAST  CT CERVICAL SPINE WITHOUT CONTRAST  TECHNIQUE: Multidetector CT imaging of the head and cervical spine was performed following the standard protocol without intravenous contrast. Multiplanar CT image reconstructions  of the cervical spine were also generated.  COMPARISON:  None.  FINDINGS: CT HEAD FINDINGS  There is hyperdense proximal LEFT MCA, indicating acute thrombosis of the LEFT middle cerebral artery in its M1 and M2 segments. This is accompanied by a large area of cytotoxic edema throughout much of the frontal, temporal, insular, and basal ganglia regions, consistent with acute infarction. There is superimposed reperfusion type hemorrhage in the LEFT basal ganglia, with some LEFT lateral intraventricular extension. Slight left-to-right midline shift of 3 mm is noted.  Generalized atrophy. Chronic microvascular ischemic change. No hydrocephalus, or extra-axial fluid. Calvarium intact. No sinus or mastoid air fluid level. Suspected chronic BILATERAL ethmoid sinus disease. BILATERAL cataract extraction.  CT CERVICAL SPINE FINDINGS  There is no visible cervical spine fracture, traumatic subluxation, prevertebral soft tissue swelling, or intraspinal hematoma. Moderately advanced spondylosis, worst at C4-C5. Carotid atherosclerosis of the bifurcations. Thyroid goiter, greater on the LEFT, with early substernal extension. Incompletely evaluated RIGHT pleural effusion.  IMPRESSION: Acute LEFT MCA large vessel thrombosis with a significant area of acute infarction involving much of the LEFT MCA territory. More centrally within the basal ganglia, there is reperfusion  type hemorrhage which has extended into the LEFT lateral ventricle. Mild left-to-right shift approximately 3 mm.  Cervical spondylosis without fracture or traumatic subluxation.  Carotid atherosclerosis.  Findings discussed with ordering provider at time of interpretation, 3:35 p.m.   Electronically Signed   By: Davonna Belling M.D.   On: November 19, 2014 15:44   Dg Pelvis Portable  Nov 19, 2014   CLINICAL DATA:  Fall.  Found unresponsive.  Initial encounter.  EXAM: PORTABLE PELVIS 1-2 VIEWS  COMPARISON:  10/19/2006 lumbar spine exam.  FINDINGS: No fracture noted.  Evaluation  of sacrum slightly limited by bowel.  If the patient turns out to have hip pain than cone-down series of the hips recommended.  Degenerative changes L4-5 level.  Vascular calcifications.  IMPRESSION: No fracture.  Please see above.   Electronically Signed   By: Lacy Duverney M.D.   On: Nov 19, 2014 15:24   Dg Chest Portable 1 View  November 19, 2014   CLINICAL DATA:  81 year old female with repositioning of endotracheal tube. Subsequent encounter.  EXAM: PORTABLE CHEST - 1 VIEW  COMPARISON:  11/19/14 2:55 p.m.  FINDINGS: Endotracheal tube has been retracted. The tip remains just above the carina. Recommend retracting by 2 cm to avoid mainstem bronchus intubation with change in patient's neck position.  Nasogastric tube courses below the diaphragm. Tip is not included on the present exam.  Left base subsegmental atelectasis.  Pulmonary vascular prominence most notable centrally.  No gross pneumothorax.  Calcified tortuous aorta.  Calcified carotid bifurcation bilaterally.  Heart size top-normal.  IMPRESSION: Endotracheal tube has been retracted. The tip remains just above the carina. Recommend retracting by 2 cm to avoid mainstem bronchus intubation with change in patient's neck position.  Please see above.   Electronically Signed   By: Lacy Duverney M.D.   On: 11-19-14 15:59   Dg Chest Portable 1 View  November 19, 2014   CLINICAL DATA:  Fall, ETT placement.  EXAM: PORTABLE CHEST - 1 VIEW  COMPARISON:  Chest radiograph 09/19/2012.  FINDINGS: Cardiomegaly.  Mild vascular congestion.  No pneumothorax.  ET tube has been inserted and lies in the RIGHT mainstem bronchus. It should be withdrawn 5-6 cm.  Calcified tortuous aorta. No significant pleural effusion. No definite acute rib fractures. Chronic LEFT 8th rib fracture with callus,  IMPRESSION: Cardiomegaly with mild vascular congestion.  ETT too low, in RIGHT mainstem bronchus, should be withdrawn 5-6 cm ; I discussed the findings with the ordering provider.    Electronically Signed   By: Davonna Belling M.D.   On: 11-19-14 15:24    Medications:  I have reviewed the patient's current medications. Scheduled: . antiseptic oral rinse  7 mL Mouth Rinse QID    Assessment/Plan: Patient unchanged.  Prognosis unchanged.  Comfort measures continue.     LOS: 1 day   Thana Farr, MD [facility] [phone] 10/31/2014  4:50 PM

## 2014-10-31 NOTE — Progress Notes (Signed)
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  Her family is family friend.  Pt never wanted this type of aggressive care.  Daughter is in Insurance risk surveyoragreeance.  Grandchildren and Daughter/ son in law arriving this AM. Plan withdrawal of care later this am.  I stopped the amiodarone. Would not Rx afib or any arrhythmia .  Continue propofol/mso4 for now.  Dorcas Carrowatrick WrightMD Beeper  [phone]  Cell  [phone]  If no response or cell goes to voicemail, call beeper [phone] 7:51 AM 10/31/2014

## 2014-10-31 NOTE — Progress Notes (Signed)
UR Completed.  [phone]  

## 2014-10-31 NOTE — Progress Notes (Signed)
Nutrition Brief Note  Chart reviewed. Pt expected to withdraw care this AM, once family arrives.  Pt now transitioning to comfort care.  No further nutrition interventions warranted at this time.  Please re-consult as needed.   Fredericka Bottcher A. Mayford KnifeWilliams, RD, LDN, CDE Pager: [phone] After hours Pager: [phone]

## 2014-10-31 NOTE — Progress Notes (Signed)
eLink Physician-Brief Progress Note Patient Name: Isabel GrillsCharlotte Herzig DOB: 07/08/1920 MRN: [id number]   Date of Service  10/31/2014  HPI/Events of Note  New onset AFIB with Ventricular response 120-140.  eICU Interventions  Will order: 1. BMP and Magnesium level. 2. Amiodarone IV load and infusion.     Intervention Category Major Interventions: Arrhythmia - evaluation and management  Alizey Noren Eugene 10/31/2014, 2:33 AM

## 2014-10-31 NOTE — Clinical Documentation Improvement (Signed)
Presents with acute left MCA infarct with reperfusion hemorrhage extending to left lateral ventricle.   CT of cervical spine wo contrast reveals large area of cytotoxic edema  Please assess CT results and render an opinion in next progress note and include in discharge summary if applicable. Coders and CDI may not code directly from radiology reports; it must be documented by Primary Care Providers.  Thank You, Shellee MiloEileen T Humaira Sculley ,RN Clinical Documentation Specialist:  [phone]  [facility]- Health Information Management

## 2014-10-31 NOTE — Progress Notes (Signed)
PULMONARY / CRITICAL CARE MEDICINE   Name: [name] MRN: [id number] DOB: 04/26/1920    ADMISSION DATE:  Nov 18, 2014 CONSULTATION DATE:  10/31/2014  REFERRING MD :  EDP  CHIEF COMPLAINT:  AMS  INITIAL PRESENTATION:  81 y.o. F brought to [facility] ED 3/29 with AMS after being found down.  CT revealed large left MCA infarct with reperfusion hemorrhage.  Pt intubated and PCCM called to admit.    During discussions with family, pt's daughter informed Dr. Molli Knock that pt was meant to be DNR / DNI.  She has requested that since pt is already intubated, that we continue mechanical ventilation until tomorrow morning when rest of family arrives at which point they will likely withdraw care.  Family flying in from out of town this PM.   STUDIES:  Pelvis XRay 3/29 >>> no fracture. CXR 3/29 9>>> right mainstem intubation.  Cardiomegaly with mild vascular congestion. CT head 3/29 >>> acute left MCA large vessel thrombosis / infarct with reperfusion type hemorrhage wich has extended into the left lateral ventricle.  Mild L to R shift of 3mm. CT spine >> edema  SIGNIFICANT EVENTS: 3/29 - admit   PAST MEDICAL HISTORY :   has a past medical history of Allergy; COPD (chronic obstructive pulmonary disease); Hyperlipidemia; Hypertension; Osteoporosis; Gout; and PMR (polymyalgia rheumatica).  has past surgical history that includes Dilation and curettage of uterus; fractured arm; and Thyroglossal duct cyst.   SUBJECTIVE: unresponsive on morphine Off propofol gtt  VITAL SIGNS: Temp:  [98.1 F (36.7 C)-101.6 F (38.7 C)] 99.3 F (37.4 C) (03/30 0700) Pulse Rate:  [25-133] 93 (03/30 0827) Resp:  [15-40] 16 (03/30 0827) BP: (75-191)/(37-125) 87/65 mmHg (03/30 0827) SpO2:  [85 %-100 %] 99 % (03/30 0827) FiO2 (%):  [60 %-100 %] 60 % (03/30 0827) Weight:  [63.4 kg (139 lb 12.4 oz)-64.9 kg (143 lb 1.3 oz)] 64.9 kg (143 lb 1.3 oz) (03/30 0304) HEMODYNAMICS:   VENTILATOR SETTINGS: Vent Mode:  [-]  PRVC FiO2 (%):  [60 %-100 %] 60 % Set Rate:  [16 bmp] 16 bmp Vt Set:  [400 mL] 400 mL PEEP:  [5 cmH20] 5 cmH20 Plateau Pressure:  [10 cmH20-18 cmH20] 10 cmH20 INTAKE / OUTPUT: Intake/Output      03/29 0701 - 03/30 0700 03/30 0701 - 03/31 0700   I.V. (mL/kg) 393.8 (6.1) 9.8 (0.2)   Total Intake(mL/kg) 393.8 (6.1) 9.8 (0.2)   Urine (mL/kg/hr) 885    Total Output 885     Net -491.2 +9.8          PHYSICAL EXAMINATION: General: Elderly female, in NAD. Neuro: Sedated on vent. RASS-5, not localise HEENT: [place]/AT. pinpt pupils, arcus senilis. Cardiovascular: RRR, no M/R/G.  Lungs: Respirations even and unlabored.  Coarse bilaterally. Abdomen: BS x 4, soft, NT/ND.  Musculoskeletal: No gross deformities, no edema.  Skin: Intact, warm, no rashes.  LABS:  CBC  Recent Labs Lab 11-18-14 1430 11-18-14 1441  WBC 13.2*  --   HGB 13.9 15.0  HCT 40.3 44.0  PLT 160  --    Coag's  Recent Labs Lab 11-18-2014 1430  INR 1.14   BMET  Recent Labs Lab 2014-11-18 1430 11/18/14 1441 10/31/14 0239  NA 140 141 142  K 3.9 3.9 3.5  CL 109 109 113*  CO2 25  --  23  BUN 20 22 17   CREATININE 0.75 0.60 0.83  GLUCOSE 130* 131* 118*   Electrolytes  Recent Labs Lab 11-18-14 1430 10/31/14 0239  CALCIUM 9.3 8.5  MG  --  1.7   Sepsis Markers  Recent Labs Lab 10/20/2014 1441 10/06/2014 1804  LATICACIDVEN 1.20 1.21   ABG  Recent Labs Lab 10/24/2014 1544  PHART 7.300*  PCO2ART 33.5*  PO2ART 52.0*   Liver Enzymes  Recent Labs Lab 10/24/2014 1430  AST 39*  ALT 30  ALKPHOS 75  BILITOT 1.4*  ALBUMIN 4.2   Cardiac Enzymes  Recent Labs Lab 10/18/2014 1430  TROPONINI 0.03   Glucose  Recent Labs Lab 10/20/2014 1759 10/16/2014 1923  GLUCAP 122* 112*    Imaging Ct Head Wo Contrast  10/18/2014   CLINICAL DATA:  Patient found down at home, currently unresponsive. Last seen normal time unknown.  EXAM: CT HEAD WITHOUT CONTRAST  CT CERVICAL SPINE WITHOUT CONTRAST  TECHNIQUE:  Multidetector CT imaging of the head and cervical spine was performed following the standard protocol without intravenous contrast. Multiplanar CT image reconstructions of the cervical spine were also generated.  COMPARISON:  None.  FINDINGS: CT HEAD FINDINGS  There is hyperdense proximal LEFT MCA, indicating acute thrombosis of the LEFT middle cerebral artery in its M1 and M2 segments. This is accompanied by a large area of cytotoxic edema throughout much of the frontal, temporal, insular, and basal ganglia regions, consistent with acute infarction. There is superimposed reperfusion type hemorrhage in the LEFT basal ganglia, with some LEFT lateral intraventricular extension. Slight left-to-right midline shift of 3 mm is noted.  Generalized atrophy. Chronic microvascular ischemic change. No hydrocephalus, or extra-axial fluid. Calvarium intact. No sinus or mastoid air fluid level. Suspected chronic BILATERAL ethmoid sinus disease. BILATERAL cataract extraction.  CT CERVICAL SPINE FINDINGS  There is no visible cervical spine fracture, traumatic subluxation, prevertebral soft tissue swelling, or intraspinal hematoma. Moderately advanced spondylosis, worst at C4-C5. Carotid atherosclerosis of the bifurcations. Thyroid goiter, greater on the LEFT, with early substernal extension. Incompletely evaluated RIGHT pleural effusion.  IMPRESSION: Acute LEFT MCA large vessel thrombosis with a significant area of acute infarction involving much of the LEFT MCA territory. More centrally within the basal ganglia, there is reperfusion type hemorrhage which has extended into the LEFT lateral ventricle. Mild left-to-right shift approximately 3 mm.  Cervical spondylosis without fracture or traumatic subluxation.  Carotid atherosclerosis.  Findings discussed with ordering provider at time of interpretation, 3:35 p.m.   Electronically Signed   By: Davonna Belling M.D.   On: 10/12/2014 15:44   Ct Cervical Spine Wo Contrast  10/31/2014    CLINICAL DATA:  Patient found down at home, currently unresponsive. Last seen normal time unknown.  EXAM: CT HEAD WITHOUT CONTRAST  CT CERVICAL SPINE WITHOUT CONTRAST  TECHNIQUE: Multidetector CT imaging of the head and cervical spine was performed following the standard protocol without intravenous contrast. Multiplanar CT image reconstructions of the cervical spine were also generated.  COMPARISON:  None.  FINDINGS: CT HEAD FINDINGS  There is hyperdense proximal LEFT MCA, indicating acute thrombosis of the LEFT middle cerebral artery in its M1 and M2 segments. This is accompanied by a large area of cytotoxic edema throughout much of the frontal, temporal, insular, and basal ganglia regions, consistent with acute infarction. There is superimposed reperfusion type hemorrhage in the LEFT basal ganglia, with some LEFT lateral intraventricular extension. Slight left-to-right midline shift of 3 mm is noted.  Generalized atrophy. Chronic microvascular ischemic change. No hydrocephalus, or extra-axial fluid. Calvarium intact. No sinus or mastoid air fluid level. Suspected chronic BILATERAL ethmoid sinus disease. BILATERAL cataract extraction.  CT CERVICAL SPINE FINDINGS  There is no  visible cervical spine fracture, traumatic subluxation, prevertebral soft tissue swelling, or intraspinal hematoma. Moderately advanced spondylosis, worst at C4-C5. Carotid atherosclerosis of the bifurcations. Thyroid goiter, greater on the LEFT, with early substernal extension. Incompletely evaluated RIGHT pleural effusion.  IMPRESSION: Acute LEFT MCA large vessel thrombosis with a significant area of acute infarction involving much of the LEFT MCA territory. More centrally within the basal ganglia, there is reperfusion type hemorrhage which has extended into the LEFT lateral ventricle. Mild left-to-right shift approximately 3 mm.  Cervical spondylosis without fracture or traumatic subluxation.  Carotid atherosclerosis.  Findings discussed  with ordering provider at time of interpretation, 3:35 p.m.   Electronically Signed   By: Davonna BellingJohn  Curnes M.D.   On: 09/27/14 15:44   Dg Pelvis Portable  02/21/15   CLINICAL DATA:  Fall.  Found unresponsive.  Initial encounter.  EXAM: PORTABLE PELVIS 1-2 VIEWS  COMPARISON:  10/19/2006 lumbar spine exam.  FINDINGS: No fracture noted.  Evaluation of sacrum slightly limited by bowel.  If the patient turns out to have hip pain than cone-down series of the hips recommended.  Degenerative changes L4-5 level.  Vascular calcifications.  IMPRESSION: No fracture.  Please see above.   Electronically Signed   By: Lacy DuverneySteven  Olson M.D.   On: 09/27/14 15:24   Dg Chest Portable 1 View  02/21/15   CLINICAL DATA:  81 year old female with repositioning of endotracheal tube. Subsequent encounter.  EXAM: PORTABLE CHEST - 1 VIEW  COMPARISON:  09/27/14 2:55 p.m.  FINDINGS: Endotracheal tube has been retracted. The tip remains just above the carina. Recommend retracting by 2 cm to avoid mainstem bronchus intubation with change in patient's neck position.  Nasogastric tube courses below the diaphragm. Tip is not included on the present exam.  Left base subsegmental atelectasis.  Pulmonary vascular prominence most notable centrally.  No gross pneumothorax.  Calcified tortuous aorta.  Calcified carotid bifurcation bilaterally.  Heart size top-normal.  IMPRESSION: Endotracheal tube has been retracted. The tip remains just above the carina. Recommend retracting by 2 cm to avoid mainstem bronchus intubation with change in patient's neck position.  Please see above.   Electronically Signed   By: Lacy DuverneySteven  Olson M.D.   On: 09/27/14 15:59   Dg Chest Portable 1 View  02/21/15   CLINICAL DATA:  Fall, ETT placement.  EXAM: PORTABLE CHEST - 1 VIEW  COMPARISON:  Chest radiograph 09/19/2012.  FINDINGS: Cardiomegaly.  Mild vascular congestion.  No pneumothorax.  ET tube has been inserted and lies in the RIGHT mainstem bronchus. It should  be withdrawn 5-6 cm.  Calcified tortuous aorta. No significant pleural effusion. No definite acute rib fractures. Chronic LEFT 8th rib fracture with callus,  IMPRESSION: Cardiomegaly with mild vascular congestion.  ETT too low, in RIGHT mainstem bronchus, should be withdrawn 5-6 cm ; I discussed the findings with the ordering provider.   Electronically Signed   By: Davonna BellingJohn  Curnes M.D.   On: 09/27/14 15:24     ASSESSMENT / PLAN:  PULMONARY OETT 3/29 >>> A: Acute respiratory failure following acute ischemic left MCA infarct with reperfusion hemorrhage COPD P:    Family flying in from out of town.  Will withdraw vent once family arrived and at bedside.  CARDIOVASCULAR A:  Hx HTN, HLD DNR Status P:  Monitor hemodynamics. No CVL's or pressors.   NEUROLOGIC A:   Acute left MCA infarct with reperfusion type hemorrhage which has extended into the left lateral ventricle.  Mild L to R shift of 3mm. Acute  cerebral edema P:   Sedation:  Morphine gtt . RASS goal: 0 to -1.  Pt was meant to be DNR / DNI; therefore, we will not order any blood draws or further imaging.  Pt's family members from out of town are flying in this afternoon and will be at hospital tomorrow morning.  Once family arrives and are ready, they will withdraw (at some point tomorrow morning 3/30).   Family updated: Daughter by Dr. Molli Knock 3/29  Interdisciplinary Family Meeting v Palliative Care Meeting:  N/A - family withdrawing in AM 3/30.  The patient is critically ill with multiple organ systems failure and requires high complexity decision making for assessment and support, frequent evaluation and titration of therapies, application of advanced monitoring technologies and extensive interpretation of multiple databases. Critical Care Time devoted to patient care services described in this note independent of APP time is 31 minutes.   Oretha Milch MD 10/31/2014, 9:06 AM

## 2014-11-01 DIAGNOSIS — R4020 Unspecified coma: Secondary | ICD-10-CM | POA: Diagnosis not present

## 2014-11-01 DIAGNOSIS — Z515 Encounter for palliative care: Secondary | ICD-10-CM

## 2014-11-01 MED ORDER — MORPHINE SULFATE 25 MG/ML IV SOLN: 1.0000 mg/h | INTRAVENOUS | Status: DC

## 2014-11-01 MED ORDER — MORPHINE BOLUS VIA INFUSION
2.0000 mg | INTRAVENOUS | Status: DC | PRN
  Administered 2014-11-01: 4 mg via INTRAVENOUS
  Filled 2014-11-01 (×2): qty 4

## 2014-11-02 NOTE — Progress Notes (Signed)
Chaplain responded to page that pt had passed and family needed support.  Chaplain had met family previously when pt was on another unit.  Chaplain spent time at bedside with family while they shared stories about the pt.  Family asked for prayer which chaplain provided along with emotional and spiritual support.  Family grieving appropriately and chaplain available if further support necessary.   Mertie Moores, Chaplain 11/02/2014

## 2014-11-02 NOTE — Progress Notes (Signed)
ME called pt. does not meet criteria for exam.

## 2014-11-02 NOTE — Progress Notes (Signed)
Chaplain Note:  I received a request to provide support to family of Mrs. Cieslewicz. As I arrived at room I met patient's daughter, son in law and grandson, as well as Dr. Joya Gaskins her physician and a family friend.   The family are experiencing normal grief and seem to have made a decision to withdrawal care noting that Mrs. Bittman was a very independent and active woman who would not what to continue in her current state. The family spoke with affection about her and her life, and how she had had a good Easter day with the family, which now is an important memory.They spoke of her younger brother, age 79, who is in an assisted living facility locally and who Mrs. Lightsey had supported with daily visits.  The family also includes a granddaughter and several great grandchildren.   THe family requested that I pray for the patient and for them - for peace, wisdom and acceptance - which I did.   I will follow up with the family on 3/31.  Sue Lush - [phone]

## 2014-11-02 NOTE — Progress Notes (Signed)
   02/12/2015 1305  Vent Select  Invasive or Noninvasive Invasive  Adult Vent Y  Adult Ventilator Settings  Vent Type Servo i  Vent Mode PSV  Pressure Support 5 cmH20  PEEP 5 cmH20  Adult Ventilator Measurements  Resp Rate Total 9 br/min  Exhaled Vt 608 mL  Measured Ve 5.1 mL  HOB> 30 Degrees Y  Placed patient on pressure support 5 and peep of 5 per RCP terminal wean protocol.  RN present during change titrating morphine.  Patient is comfortable at this time.  Will extubate per protocol.

## 2014-11-02 NOTE — Progress Notes (Signed)
Chaplain responded to  End of Life consult.  Pt's family in room at bedside grieving appropriately.  Chaplain provided emotional and spiritual support and will follow up as needed.      10/16/2014 1600  Clinical Encounter Type  Visited With Patient and family together  Visit Type Follow-up;Spiritual support;Critical Care  Referral From Nurse  Spiritual Encounters  Spiritual Needs Emotional;Grief support  Stress Factors  Patient Stress Factors Health changes  Family Stress Factors Exhausted;Family relationships   Blain PaisOvercash, Jevin Camino A, Chaplain 10/21/2014 4:11 PM

## 2014-11-02 NOTE — Progress Notes (Signed)
Extubated patient per RCP protocol RN Doyle AskewJulian Breabraut present and at bedside.

## 2014-11-02 NOTE — Progress Notes (Signed)
[name] Donor referral # [id number]. Pt. is not a canidate for donor

## 2014-11-02 NOTE — Progress Notes (Signed)
Subjective: Patient remains intubated.  All sedation stopped yesterday.    Objective: Current vital signs: BP 76/37 mmHg  Pulse 60  Temp(Src) 98.5 F (36.9 C) (Oral)  Resp 16  Ht  (1.575 m)  Wt 63.6 kg (140 lb 3.4 oz)  BMI 25.64 kg/m2  SpO2 97% Vital signs in last 24 hours: Temp:  [98.3 F (36.8 C)-101.1 F (38.4 C)] 98.5 F (36.9 C) (03/31 0700) Pulse Rate:  [35-143] 60 (03/31 1000) Resp:  [12-23] 16 (03/31 1000) BP: (52-92)/(28-63) 76/37 mmHg (03/31 1000) SpO2:  [94 %-99 %] 97 % (03/31 1000) FiO2 (%):  [50 %] 50 % (03/31 0743) Weight:  [63.6 kg (140 lb 3.4 oz)] 63.6 kg (140 lb 3.4 oz) (03/31 0409)  Intake/Output from previous day: 03/30 0701 - 03/31 0700 In: 55.8 [I.V.:55.8] Out: 33 [Urine:33] Intake/Output this shift: Total I/O In: 6 [I.V.:6] Out: 5 [Urine:5] Nutritional status: Diet NPO time specified  Neurologic Exam: Mental Status: Patient does not respond to verbal stimuli.  With deep sternal rub grimaces and some posturing on the LUE.  Does not follow commands.  No verbalizations noted.  Cranial Nerves: II: patient does not respond confrontation bilaterally, pupils right 2 mm, left 2 mm,and unreactive bilaterally III,IV,VI: doll's response absent bilaterally. Left eye external deviated.  Right eye midposition.   V,VII: corneal reflex absent on the right  VIII: patient does not respond to verbal stimuli IX,X: gag reflex reduced, XI: trapezius strength unable to test bilaterally XII: tongue strength unable to test Motor: Extremities flaccid throughout.  No spontaneous movement noted.  No purposeful movements noted. Sensory: Does not respond to noxious stimuli in any extremity. Plantars: upgoing bilaterally Cerebellar: Unable to perform    Lab Results: Basic Metabolic Panel:  Recent Labs Lab 10/20/2014 1430 10/21/2014 1441 10/31/14 0239  NA 140 141 142  K 3.9 3.9 3.5  CL 109 109 113*  CO2 25  --  23  GLUCOSE 130* 131* 118*  BUN CREATININE 0.75 0.60 0.83  CALCIUM 9.3  --  8.5  MG  --   --  1.7    Liver Function Tests:  Recent Labs Lab 10/08/2014 1430  AST 39*  ALT 30  ALKPHOS 75  BILITOT 1.4*  PROT 7.0  ALBUMIN 4.2   No results for input(s): LIPASE, AMYLASE in the last 168 hours. No results for input(s): AMMONIA in the last 168 hours.  CBC:  Recent Labs Lab 10/07/2014 1430 10/15/2014 1441  WBC 13.2*  --   HGB 13.9 15.0  HCT 40.3 44.0  MCV 92.2  --   PLT 160  --     Cardiac Enzymes:  Recent Labs Lab 10/15/2014 1430  CKTOTAL 355*  CKMB 11.5*  TROPONINI 0.03    Lipid Panel: No results for input(s): CHOL, TRIG, HDL, CHOLHDL, VLDL, LDLCALC in the last 168 hours.  CBG:  Recent Labs Lab 10/07/2014 1759 10/09/2014 1923  GLUCAP 122* 112*    Microbiology: Results for orders placed or performed during the hospital encounter of 10/16/2014  Urine culture     Status: None   Collection Time: 10/29/2014  3:39 PM  Result Value Ref Range Status   Specimen Description URINE, CATHETERIZED  Final   Special Requests NONE  Final   Colony Count NO GROWTH Performed at [facility]   Final   Culture NO GROWTH Performed at [facility]   Final   Report Status 10/31/2014 FINAL  Final  MRSA PCR Screening  Status: None   Collection Time: Nov 19, 2014  6:15 PM  Result Value Ref Range Status   MRSA by PCR NEGATIVE NEGATIVE Final    Comment:        The GeneXpert MRSA Assay (FDA approved for NASAL specimens only), is one component of a comprehensive MRSA colonization surveillance program. It is not intended to diagnose MRSA infection nor to guide or monitor treatment for MRSA infections.     Coagulation Studies:  Recent Labs  11-19-2014 1430  LABPROT 14.8  INR 1.14    Imaging: Ct Head Wo Contrast  11/19/14   CLINICAL DATA:  Patient found down at home, currently unresponsive. Last seen normal time unknown.  EXAM: CT HEAD WITHOUT CONTRAST  CT CERVICAL SPINE WITHOUT CONTRAST   TECHNIQUE: Multidetector CT imaging of the head and cervical spine was performed following the standard protocol without intravenous contrast. Multiplanar CT image reconstructions of the cervical spine were also generated.  COMPARISON:  None.  FINDINGS: CT HEAD FINDINGS  There is hyperdense proximal LEFT MCA, indicating acute thrombosis of the LEFT middle cerebral artery in its M1 and M2 segments. This is accompanied by a large area of cytotoxic edema throughout much of the frontal, temporal, insular, and basal ganglia regions, consistent with acute infarction. There is superimposed reperfusion type hemorrhage in the LEFT basal ganglia, with some LEFT lateral intraventricular extension. Slight left-to-right midline shift of 3 mm is noted.  Generalized atrophy. Chronic microvascular ischemic change. No hydrocephalus, or extra-axial fluid. Calvarium intact. No sinus or mastoid air fluid level. Suspected chronic BILATERAL ethmoid sinus disease. BILATERAL cataract extraction.  CT CERVICAL SPINE FINDINGS  There is no visible cervical spine fracture, traumatic subluxation, prevertebral soft tissue swelling, or intraspinal hematoma. Moderately advanced spondylosis, worst at C4-C5. Carotid atherosclerosis of the bifurcations. Thyroid goiter, greater on the LEFT, with early substernal extension. Incompletely evaluated RIGHT pleural effusion.  IMPRESSION: Acute LEFT MCA large vessel thrombosis with a significant area of acute infarction involving much of the LEFT MCA territory. More centrally within the basal ganglia, there is reperfusion type hemorrhage which has extended into the LEFT lateral ventricle. Mild left-to-right shift approximately 3 mm.  Cervical spondylosis without fracture or traumatic subluxation.  Carotid atherosclerosis.  Findings discussed with ordering provider at time of interpretation, 3:35 p.m.   Electronically Signed   By: Davonna Belling M.D.   On: 11/19/14 15:44   Ct Cervical Spine Wo  Contrast  November 19, 2014   CLINICAL DATA:  Patient found down at home, currently unresponsive. Last seen normal time unknown.  EXAM: CT HEAD WITHOUT CONTRAST  CT CERVICAL SPINE WITHOUT CONTRAST  TECHNIQUE: Multidetector CT imaging of the head and cervical spine was performed following the standard protocol without intravenous contrast. Multiplanar CT image reconstructions of the cervical spine were also generated.  COMPARISON:  None.  FINDINGS: CT HEAD FINDINGS  There is hyperdense proximal LEFT MCA, indicating acute thrombosis of the LEFT middle cerebral artery in its M1 and M2 segments. This is accompanied by a large area of cytotoxic edema throughout much of the frontal, temporal, insular, and basal ganglia regions, consistent with acute infarction. There is superimposed reperfusion type hemorrhage in the LEFT basal ganglia, with some LEFT lateral intraventricular extension. Slight left-to-right midline shift of 3 mm is noted.  Generalized atrophy. Chronic microvascular ischemic change. No hydrocephalus, or extra-axial fluid. Calvarium intact. No sinus or mastoid air fluid level. Suspected chronic BILATERAL ethmoid sinus disease. BILATERAL cataract extraction.  CT CERVICAL SPINE FINDINGS  There is no visible cervical  spine fracture, traumatic subluxation, prevertebral soft tissue swelling, or intraspinal hematoma. Moderately advanced spondylosis, worst at C4-C5. Carotid atherosclerosis of the bifurcations. Thyroid goiter, greater on the LEFT, with early substernal extension. Incompletely evaluated RIGHT pleural effusion.  IMPRESSION: Acute LEFT MCA large vessel thrombosis with a significant area of acute infarction involving much of the LEFT MCA territory. More centrally within the basal ganglia, there is reperfusion type hemorrhage which has extended into the LEFT lateral ventricle. Mild left-to-right shift approximately 3 mm.  Cervical spondylosis without fracture or traumatic subluxation.  Carotid  atherosclerosis.  Findings discussed with ordering provider at time of interpretation, 3:35 p.m.   Electronically Signed   By: Davonna BellingJohn  Curnes M.D.   On: 08/31/2014 15:44   Dg Pelvis Portable  09-Dec-2014   CLINICAL DATA:  Fall.  Found unresponsive.  Initial encounter.  EXAM: PORTABLE PELVIS 1-2 VIEWS  COMPARISON:  10/19/2006 lumbar spine exam.  FINDINGS: No fracture noted.  Evaluation of sacrum slightly limited by bowel.  If the patient turns out to have hip pain than cone-down series of the hips recommended.  Degenerative changes L4-5 level.  Vascular calcifications.  IMPRESSION: No fracture.  Please see above.   Electronically Signed   By: Lacy DuverneySteven  Olson M.D.   On: 08/31/2014 15:24   Dg Chest Portable 1 View  09-Dec-2014   CLINICAL DATA:  81 year old female with repositioning of endotracheal tube. Subsequent encounter.  EXAM: PORTABLE CHEST - 1 VIEW  COMPARISON:  08/31/2014 2:55 p.m.  FINDINGS: Endotracheal tube has been retracted. The tip remains just above the carina. Recommend retracting by 2 cm to avoid mainstem bronchus intubation with change in patient's neck position.  Nasogastric tube courses below the diaphragm. Tip is not included on the present exam.  Left base subsegmental atelectasis.  Pulmonary vascular prominence most notable centrally.  No gross pneumothorax.  Calcified tortuous aorta.  Calcified carotid bifurcation bilaterally.  Heart size top-normal.  IMPRESSION: Endotracheal tube has been retracted. The tip remains just above the carina. Recommend retracting by 2 cm to avoid mainstem bronchus intubation with change in patient's neck position.  Please see above.   Electronically Signed   By: Lacy DuverneySteven  Olson M.D.   On: 08/31/2014 15:59   Dg Chest Portable 1 View  09-Dec-2014   CLINICAL DATA:  Fall, ETT placement.  EXAM: PORTABLE CHEST - 1 VIEW  COMPARISON:  Chest radiograph 09/19/2012.  FINDINGS: Cardiomegaly.  Mild vascular congestion.  No pneumothorax.  ET tube has been inserted and lies in the  RIGHT mainstem bronchus. It should be withdrawn 5-6 cm.  Calcified tortuous aorta. No significant pleural effusion. No definite acute rib fractures. Chronic LEFT 8th rib fracture with callus,  IMPRESSION: Cardiomegaly with mild vascular congestion.  ETT too low, in RIGHT mainstem bronchus, should be withdrawn 5-6 cm ; I discussed the findings with the ordering provider.   Electronically Signed   By: Davonna BellingJohn  Curnes M.D.   On: 08/31/2014 15:24    Medications:  I have reviewed the patient's current medications. Scheduled: . antiseptic oral rinse  7 mL Mouth Rinse QID    Assessment/Plan: Patient now off sedation and remains poorly responsive.  Prognosis remains poor for return to previous level of function.  Family making decision for withdrawal of care.    Recommendations: 1. Continue comfort care   LOS: 2 days   Thana FarrLeslie Moorea Boissonneault, MD [facility] [phone] 10/04/2014  10:19 AM

## 2014-11-02 NOTE — Progress Notes (Signed)
2020 Family member requested nurse to come to room, upon entering pt. found to be breathless, pulseless with pupils dilated and fixed. 2nd nurse @ [facility] Ledell PeoplesBrandyn Foust,RN to pronounce pt. as deceased.MD notified @ present time

## 2014-11-02 DEATH — deceased

## 2014-11-05 ENCOUNTER — Telehealth: Payer: Self-pay

## 2014-11-05 NOTE — Discharge Summary (Signed)
PULMONARY / CRITICAL CARE MEDICINE   Name: Isabel GrillsCharlotte [name] MRN: [id number] DOB: 06/14/1920    ADMISSION DATE:  12/17/14 CONSULTATION DATE:  11/05/2014  REFERRING MD :  EDP  CHIEF COMPLAINT:  AMS  INITIAL PRESENTATION:  81 y.o. F [facility] with AMS after being found down.  CT revealed large left MCA infarct with reperfusion hemorrhage.  Pt intubated and PCCM called to admit.    During discussions with family, pt's daughter informed Dr. Molli KnockYacoub that pt was meant to be DNR / DNI.  She has requested that since pt is already intubated, that we continue mechanical ventilation until tomorrow morning when rest of family arrives at which point they will likely withdraw care.  Family flying in from out of town this PM.   STUDIES:  Pelvis XRay 3/29 >>> no fracture. CXR 3/29 9>>> right mainstem intubation.  Cardiomegaly with mild vascular congestion. CT head 3/29 >>> acute left MCA large vessel thrombosis / infarct with reperfusion type hemorrhage wich has extended into the left lateral ventricle.  Mild L to R shift of 3mm. CT spine >> edema    COURSE ::  PULMONARY OETT 3/29 >>>3/31 A: Acute respiratory failure following acute ischemic left MCA infarct with reperfusion hemorrhage COPD P:    D/w family Will withdraw vent once family ready  CARDIOVASCULAR A:  Hx HTN, HLD  P:  No CVL's or pressors.   NEUROLOGIC A:   Acute left MCA infarct with reperfusion type hemorrhage which has extended into the left lateral ventricle.  Mild L to R shift of 3mm. Acute cerebral edema P:   Sedation:  Morphine gtt .   Vent withdrawn, she was comfortable & passed away soon after Cause of death - Acute CVA    Oretha MilchALVA,RAKESH V. MD 11/05/2014, 7:17 PM

## 2014-11-05 NOTE — Telephone Encounter (Signed)
Received cremation death certificate from [facility] 11/05/14 for Dr. Vassie LollAlva.  Will send via courier to [employer] for signature.  Received back and called for pick-up 11/06/14.

## 2014-11-22 ENCOUNTER — Ambulatory Visit: Payer: Medicare Other | Admitting: Family Medicine

## 2014-12-02 NOTE — Progress Notes (Signed)
PULMONARY / CRITICAL CARE MEDICINE   Name: [name] MRN: [id number] DOB: [birth year]    ADMISSION DATE:  2014-11-06 CONSULTATION DATE:  11/05/2014  REFERRING MD :  EDP  CHIEF COMPLAINT:  AMS  INITIAL PRESENTATION:  81 y.o. F brought to [facility] ED 3/29 with AMS after being found down.  CT revealed large left MCA infarct with reperfusion hemorrhage.  Pt intubated and PCCM called to admit.    During discussions with family, pt's daughter informed Dr. Molli Knock that pt was meant to be DNR / DNI.  She has requested that since pt is already intubated, that we continue mechanical ventilation until tomorrow morning when rest of family arrives at which point they will likely withdraw care.  Family flying in from out of town this PM.   STUDIES:  Pelvis XRay 3/29 >>> no fracture. CXR 3/29 9>>> right mainstem intubation.  Cardiomegaly with mild vascular congestion. CT head 3/29 >>> acute left MCA large vessel thrombosis / infarct with reperfusion type hemorrhage wich has extended into the left lateral ventricle.  Mild L to R shift of 3mm. CT spine >> edema  SIGNIFICANT EVENTS: 3/29 - admit   SUBJECTIVE: unresponsive on morphine  VITAL SIGNS:   HEMODYNAMICS:   VENTILATOR SETTINGS:   INTAKE / OUTPUT: Intake/Output    None     PHYSICAL EXAMINATION: General: Elderly female, in NAD. Neuro: Sedated on vent. RASS-5, not localise HEENT: [place]/AT. pinpt pupils, arcus senilis. Cardiovascular: RRR, no M/R/G.  Lungs: Respirations even and unlabored.  Coarse bilaterally. Abdomen: BS x 4, soft, NT/ND.  Musculoskeletal: No gross deformities, no edema.  Skin: Intact, warm, no rashes.  LABS:  CBC  Recent Labs Lab 2014-11-06 1430 11/06/2014 1441  WBC 13.2*  --   HGB 13.9 15.0  HCT 40.3 44.0  PLT 160  --    Coag's  Recent Labs Lab 11/06/14 1430  INR 1.14   BMET  Recent Labs Lab 11-06-2014 1430 11/06/2014 1441 10/31/14 0239  NA 140 141 142  K 3.9 3.9 3.5  CL 109 109 113*  CO2 25  --   23  BUN CREATININE 0.75 0.60 0.83  GLUCOSE 130* 131* 118*   Electrolytes  Recent Labs Lab 11-06-14 1430 10/31/14 0239  CALCIUM 9.3 8.5  MG  --  1.7   Sepsis Markers  Recent Labs Lab 11/06/14 1441 Nov 06, 2014 1804  LATICACIDVEN 1.20 1.21   ABG  Recent Labs Lab November 06, 2014 1544  PHART 7.300*  PCO2ART 33.5*  PO2ART 52.0*   Liver Enzymes  Recent Labs Lab 06-Nov-2014 1430  AST 39*  ALT 30  ALKPHOS 75  BILITOT 1.4*  ALBUMIN 4.2   Cardiac Enzymes  Recent Labs Lab 2014/11/06 1430  TROPONINI 0.03   Glucose  Recent Labs Lab 2014-11-06 1759 06-Nov-2014 1923  GLUCAP 122* 112*    Imaging No results found.   ASSESSMENT / PLAN:  PULMONARY OETT 3/29 >>> A: Acute respiratory failure following acute ischemic left MCA infarct with reperfusion hemorrhage COPD P:    D/w family Will withdraw vent once family ready  CARDIOVASCULAR A:  Hx HTN, HLD DNR Status P:  No CVL's or pressors.   NEUROLOGIC A:   Acute left MCA infarct with reperfusion type hemorrhage which has extended into the left lateral ventricle.  Mild L to R shift of 3mm. Acute cerebral edema P:   Sedation:  Morphine gtt . RASS goal: 0 to -1.   Once family arrives and are ready, they will withdraw vent. Discussed  process etc   Family updated: Daughter / grandkids 3/31  Interdisciplinary Family Meeting v Palliative Care Meeting:  N/A   The patient is critically ill with multiple organ systems failure and requires high complexity decision making for assessment and support, frequent evaluation and titration of therapies, application of advanced monitoring technologies and extensive interpretation of multiple databases. Critical Care Time devoted to patient care services described in this note independent of APP time is 31 minutes.   Oretha MilchALVA,Markiya Keefe V. MD 11/05/2014, 7:10 PM

## 2016-04-23 IMAGING — CR DG PORTABLE PELVIS
1 series · 1 of 1 positions shown · non-contrast
Comparison: 10/19/2006 lumbar spine exam.

CLINICAL DATA: Fall.  Found unresponsive.  Initial encounter.

EXAM:
PORTABLE PELVIS 1-2 VIEWS

[AP]
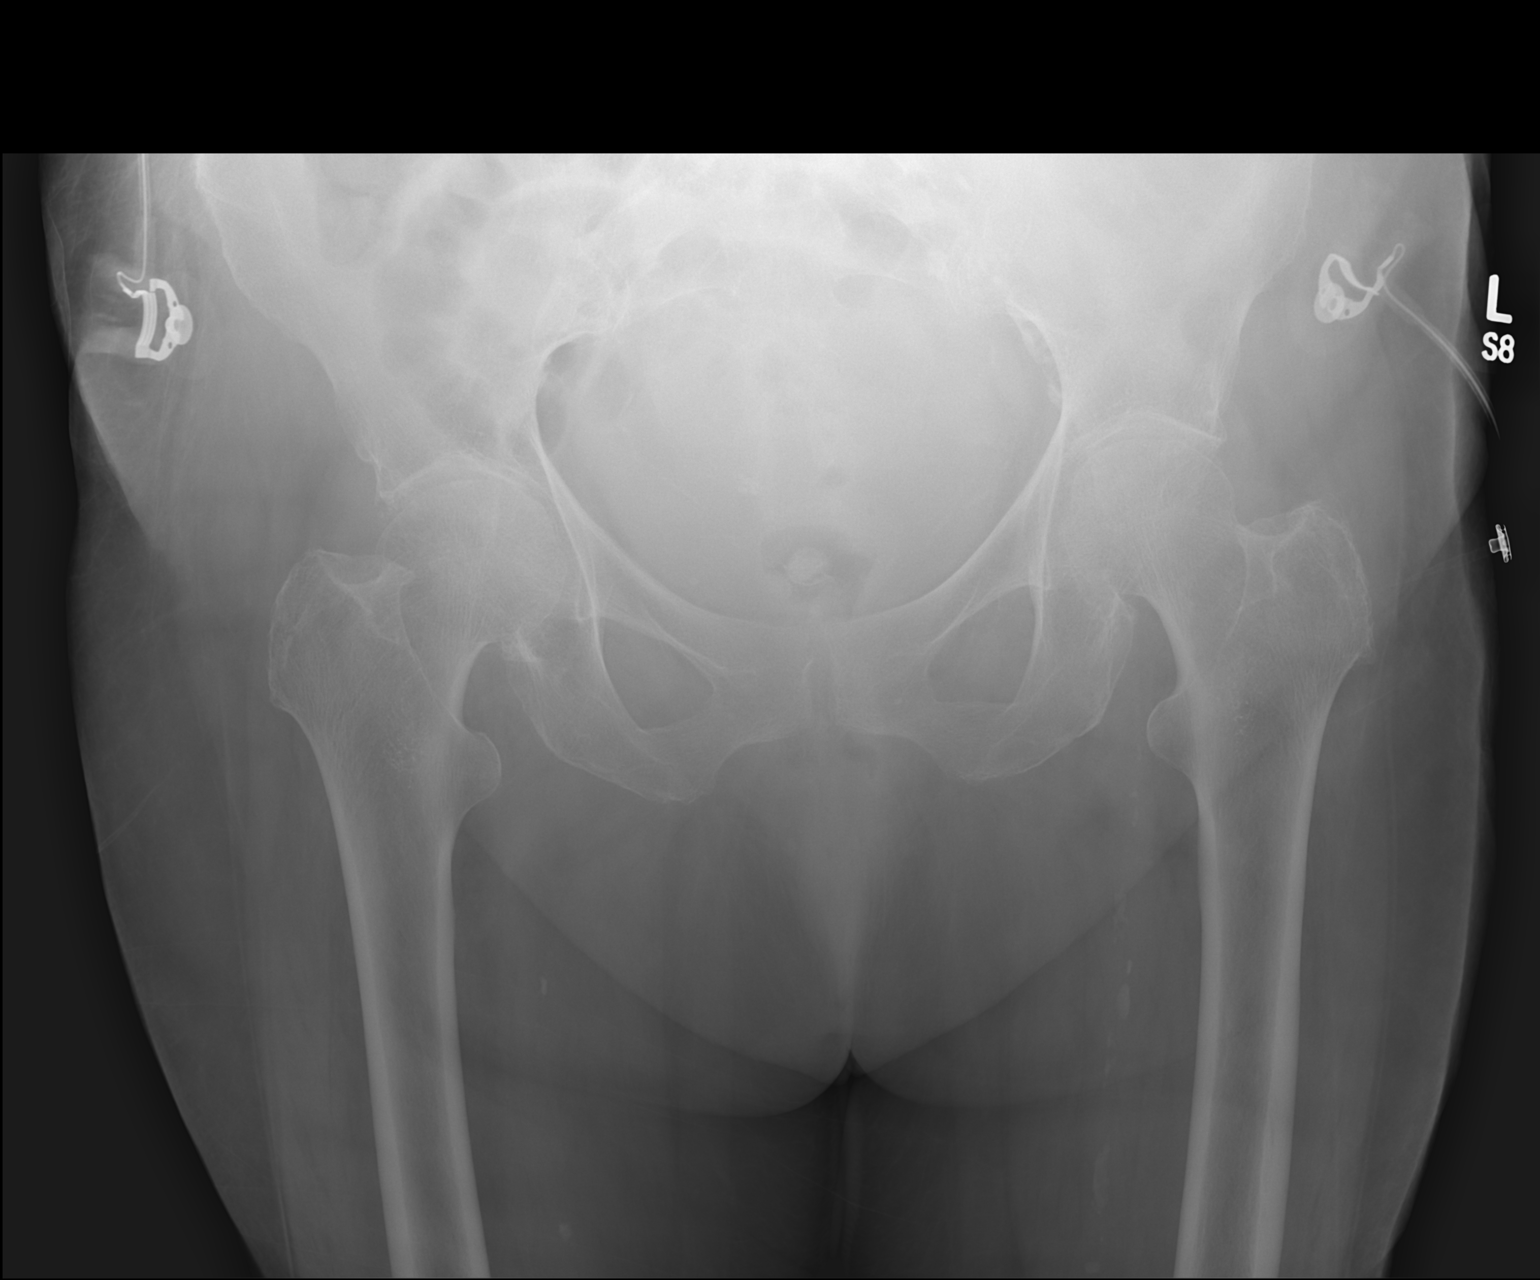

[1 of 1 positions shown; findings below may reference images not displayed]

FINDINGS: No fracture noted.  Evaluation of sacrum slightly limited by bowel.

If the patient turns out to have hip pain than cone-down series of
the hips recommended.

Degenerative changes L4-5 level.

Vascular calcifications.
IMPRESSION: No fracture.  Please see above.
